# Patient Record
Sex: Female | Born: 1975 | Race: White | Hispanic: No | Marital: Married | Smoking: Never smoker
Health system: Southern US, Community
[De-identification: ages and names within clinical notes are randomized; demographics above are authoritative.]

## PROBLEM LIST (undated history)

## (undated) DIAGNOSIS — R519 Headache, unspecified: Secondary | ICD-10-CM

## (undated) DIAGNOSIS — E0590 Thyrotoxicosis, unspecified without thyrotoxic crisis or storm: Secondary | ICD-10-CM

## (undated) DIAGNOSIS — R51 Headache: Secondary | ICD-10-CM

## (undated) DIAGNOSIS — F32A Depression, unspecified: Secondary | ICD-10-CM

## (undated) DIAGNOSIS — F329 Major depressive disorder, single episode, unspecified: Secondary | ICD-10-CM

## (undated) DIAGNOSIS — I341 Nonrheumatic mitral (valve) prolapse: Secondary | ICD-10-CM

## (undated) DIAGNOSIS — R112 Nausea with vomiting, unspecified: Secondary | ICD-10-CM

## (undated) DIAGNOSIS — Z9889 Other specified postprocedural states: Secondary | ICD-10-CM

## (undated) DIAGNOSIS — R011 Cardiac murmur, unspecified: Secondary | ICD-10-CM

## (undated) HISTORY — PX: WISDOM TOOTH EXTRACTION: SHX21

---

## 2008-04-11 ENCOUNTER — Encounter: Admission: RE | Admit: 2008-04-11 | Discharge: 2008-04-11 | Payer: Self-pay | Admitting: Obstetrics and Gynecology

## 2008-06-15 ENCOUNTER — Inpatient Hospital Stay (HOSPITAL_COMMUNITY): Admission: AD | Admit: 2008-06-15 | Discharge: 2008-06-19 | Payer: Self-pay | Admitting: Obstetrics and Gynecology

## 2010-05-21 LAB — GLUCOSE, CAPILLARY
Glucose-Capillary: 103 mg/dL — ABNORMAL HIGH (ref 70–99)
Glucose-Capillary: 76 mg/dL (ref 70–99)

## 2010-05-21 LAB — CCBB MATERNAL DONOR DRAW

## 2010-05-21 LAB — CBC
Hemoglobin: 11.6 g/dL — ABNORMAL LOW (ref 12.0–15.0)
Platelets: 193 10*3/uL (ref 150–400)
RBC: 4.28 MIL/uL (ref 3.87–5.11)
WBC: 12.6 10*3/uL — ABNORMAL HIGH (ref 4.0–10.5)

## 2010-06-25 NOTE — Discharge Summary (Signed)
Peggy [name], Peggy [name]              ACCOUNT NO.:  [id number]   MEDICAL RECORD NO.:  [id number]          PATIENT TYPE:  INP   LOCATION:  9104                          FACILITY:  [facility]   PHYSICIAN:  Freddy Finner, M.D.   DATE OF BIRTH:  [birth year]   DATE OF ADMISSION:  06/15/2008  DATE OF DISCHARGE:  06/19/2008                               DISCHARGE SUMMARY   ADMITTING DIAGNOSES:  1. Intrauterine pregnancy at 58 weeks' estimated gestational age.  2. Large for gestational age infant.  3. Gestational diabetes.  4. Induction of labor.   DISCHARGE DIAGNOSES:  1. Status post low transverse cesarean section secondary to macrosomia      and arrest of dilatation.  2. A viable female infant.   PROCEDURE:  Primary low transverse cesarean section.   REASON FOR ADMISSION:  Please see written H and P.   HOSPITAL COURSE:  The patient is a 35 year old primigravida that was  admitted to [facility] for induction of labor secondary  to large for gestational age infant and gestational diabetes.  On the  morning of admission, the patient was admitted to labor and delivery  where Pitocin was started to augment her labor.  On admission, cervix  was dilated to 1 cm, 80% effaced with vertex presentation.  Artificial  rupture of membranes was performed, which revealed watery meconium-  stained fluid.  Epidural was placed for her comfort.  Over several hours  of labor, the patient did not make further progression.  A decision was  made to proceed with a primary low transverse cesarean section.  The  patient was then transferred to the operating room where epidural was  dosed to an adequate surgical level.  A low transverse incision was made  with delivery of a viable female infant, weighing 9 pounds with Apgars  of 5 at 1-minute and 8 at 5-minute.  The patient tolerated the procedure  well and was taken to the recovery room in stable condition.  On  postoperative day #1, the patient was  without complaint.  Vital signs  were stable.  Abdomen was soft.  Abdominal dressings noted to be clean,  dry, and intact.  Fundus was firm and nontender.  Hemoglobin was 9.0.  On postoperative day #2, the patient was without complaint.  Vital signs  remained stable.  She was afebrile.  Abdominal dressing had been removed  revealing an incision that is clean, dry, and intact.  On postoperative  day #3, the patient was without complaint.  Vital signs remained stable.  She was afebrile.  Fundus was firm and nontender.  Incision was clean,  dry, and intact.  The staples were removed and the patient was later  discharged home.   CONDITION ON DISCHARGE:  Stable.   DIET:  Regular as tolerated.   ACTIVITY:  No heavy lifting, no driving x2 weeks, and no vaginal entry.   FOLLOWUP:  The patient is to follow up in the office in 1-2 weeks for an  incision check.  She is to call for temperature greater than 100  degrees, persistent nausea, vomiting, heavy vaginal  bleeding and/or  redness or drainage from the incisional site.   DISCHARGE MEDICATIONS:  1. Tylox #30 one p.o. every 4-6 hours p.r.n.  2. Motrin 600 mg every 6 hours.  3. Prenatal vitamins 1 p.o. daily.  4. Colace 1 p.o. daily p.r.n.      [name], N.P.      Freddy Finner, M.D.  Electronically Signed    CC/MEDQ  D:  06/19/2008  T:  06/19/2008  Job:  [id number]

## 2010-06-25 NOTE — Op Note (Signed)
NAMELAUREL, HARNDEN              ACCOUNT NO.:  [id number]   MEDICAL RECORD NO.:  [id number]          PATIENT TYPE:  INP   LOCATION:  9104                          FACILITY:  [facility]   PHYSICIAN:  Michelle L. Grewal, M.D.DATE OF BIRTH:  1975/05/09   DATE OF PROCEDURE:  06/16/2008  DATE OF DISCHARGE:                               OPERATIVE REPORT   PREOPERATIVE DIAGNOSIS:  Intrauterine pregnancy at 39+ weeks and arrest  of dilation at 4 cm.   POSTOPERATIVE DIAGNOSIS:  Intrauterine pregnancy at 39+ weeks and arrest  of dilation at 4 cm.   PROCEDURE:  Primary low-transverse cesarean section.   SURGEON:  Michelle L. Grewal, MD   ANESTHESIA:  Epidural.   FINDINGS:  Female infant, cephalic presentation, Apgars 9 at 1 minute  and 9 at 5 minutes, and weighed 9 pounds.   ESTIMATED BLOOD LOSS:  Less than 500 mL.   DRAINS:  Foley.   COMPLICATIONS:  None.   PROCEDURE:  The patient was taken to the operating room.  Her epidural  had been dosed and she was found to be adequate.  She was prepped and  draped in usual sterile fashion.  A Foley catheter had been inserted  while on labor and delivery and was draining clear urine.  A low  transverse incision was made, carried down to the fascia.  Fascia scored  in midline and extended laterally.  The rectus muscles were separated in  the midline.  The peritoneum was entered bluntly.  The peritoneal  incision was then stretched.  The bladder blade was inserted.  The lower  uterine segment was identified.  The bladder flap was created sharply  and then digitally.  The bladder blade was then readjusted.  A low  transverse incision was made in the uterus.  The uterus was entered  using a hemostat.  The baby was in OP position and was delivered easily  with 1 gentle pull of the vacuum extractor.  The baby was definitely LGA  and weighed 9 pounds.  Apgars were 9 at 1 minute and 9 at 5 minutes.  The baby was a female.  The baby was handed to the  awaiting neonatal  team and taken to the newborn nursery.  The uterus was exteriorized.  It  was cleared of all clots and debris.  After the placenta was removed and  noted be normal intact with a 3-vessel cord plus Pitocin antibiotics  were given.  The uterus was cleared of all clots and debris.  The  uterine incision was closed in 1 layer using O chromic in a running  locked stitch.  Uterus was returned to the abdomen.  Irrigation was  performed.  The  peritoneum and rectus muscles were reapproximated using O Vicryl.  The  fascia was closed using O Vicryl in a running stitch. After irrigation  of the subcutaneous layer, the skin was closed with staples.  All  sponge, lap, and instrument counts were correct x2.  The patient went to  recovery room in stable condition.      Michelle L. Vincente Poli, M.D.  Electronically Signed  MLG/MEDQ  D:  06/16/2008  T:  06/17/2008  Job:  [id number]

## 2011-04-02 LAB — OB RESULTS CONSOLE RPR: RPR: NONREACTIVE

## 2011-04-02 LAB — OB RESULTS CONSOLE ABO/RH

## 2011-04-02 LAB — OB RESULTS CONSOLE GC/CHLAMYDIA: Gonorrhea: NEGATIVE

## 2011-04-02 LAB — OB RESULTS CONSOLE HIV ANTIBODY (ROUTINE TESTING): HIV: NONREACTIVE

## 2011-08-27 ENCOUNTER — Encounter: Payer: Managed Care, Other (non HMO) | Attending: Obstetrics and Gynecology | Admitting: *Deleted

## 2011-08-27 VITALS — Ht 65.0 in | Wt 185.3 lb

## 2011-08-27 DIAGNOSIS — O24419 Gestational diabetes mellitus in pregnancy, unspecified control: Secondary | ICD-10-CM

## 2011-08-27 DIAGNOSIS — O99810 Abnormal glucose complicating pregnancy: Secondary | ICD-10-CM | POA: Insufficient documentation

## 2011-08-27 DIAGNOSIS — Z713 Dietary counseling and surveillance: Secondary | ICD-10-CM | POA: Insufficient documentation

## 2011-08-28 ENCOUNTER — Encounter: Payer: Self-pay | Admitting: *Deleted

## 2011-08-28 NOTE — Progress Notes (Signed)
  Patient was seen on 08/27/11 for Gestational Diabetes self-management class at the [facility]. The following learning objectives were met by the patient during this course:   States the definition of Gestational Diabetes  States why dietary management is important in controlling blood glucose  Describes the effects each nutrient has on blood glucose levels  Demonstrates ability to create a balanced meal plan  Demonstrates carbohydrate counting   States when to check blood glucose levels  Demonstrates proper blood glucose monitoring techniques  States the effect of stress and exercise on blood glucose levels  States the importance of limiting caffeine and abstaining from alcohol and smoking  Blood glucose monitor given:  One Touch Ultra Mini Self Monitoring Kit Lot # [id number] X Exp: 1/14 Blood glucose reading: 92 md/dl  Patient instructed to monitor glucose levels: FBS: 60 - <90 2 hour: <120  *Patient received handouts:  Nutrition Diabetes and Pregnancy  Carbohydrate Counting List  Patient will be seen for follow-up as needed.

## 2011-08-28 NOTE — Patient Instructions (Signed)
Goals:  Check glucose levels per MD as instructed  Follow Gestational Diabetes Diet as instructed  Call for follow-up as needed    

## 2011-10-27 ENCOUNTER — Encounter (HOSPITAL_COMMUNITY): Payer: Self-pay

## 2011-10-30 ENCOUNTER — Encounter (HOSPITAL_COMMUNITY): Payer: Self-pay

## 2011-10-31 ENCOUNTER — Encounter (HOSPITAL_COMMUNITY): Payer: Self-pay

## 2011-10-31 ENCOUNTER — Encounter (HOSPITAL_COMMUNITY)
Admission: RE | Admit: 2011-10-31 | Discharge: 2011-10-31 | Disposition: A | Discharge status: Home / Self Care | Payer: Managed Care, Other (non HMO) | Source: Ambulatory Visit | Attending: Obstetrics and Gynecology | Admitting: Obstetrics and Gynecology

## 2011-10-31 HISTORY — DX: Other specified postprocedural states: R11.2

## 2011-10-31 HISTORY — DX: Other specified postprocedural states: Z98.890

## 2011-10-31 LAB — SURGICAL PCR SCREEN: Staphylococcus aureus: POSITIVE — AB

## 2011-10-31 LAB — RPR: RPR Ser Ql: NONREACTIVE

## 2011-10-31 LAB — CBC
HCT: 37.6 % (ref 36.0–46.0)
RDW: 13 % (ref 11.5–15.5)
WBC: 9.8 10*3/uL (ref 4.0–10.5)

## 2011-10-31 LAB — TYPE AND SCREEN
ABO/RH(D): O POS
Antibody Screen: NEGATIVE

## 2011-10-31 NOTE — Patient Instructions (Signed)
Your procedure is scheduled on: 11/06/11  Enter through the Main Entrance at :0600 am Pick up desk phone and dial [phone] and inform us of your arrival.  Please call [phone] if you have any problems the morning of surgery.  Remember: Do not eat after midnight: Wed. Do not drink after:3:30 am Wed -water only  Take these meds the morning of surgery with a sip of water:none  DO NOT wear jewelry, eye make-up, lipstick,body lotion, or dark fingernail polish. Do not shave for 48 hours prior to surgery.  If you are to be admitted after surgery, leave suitcase in car until your room has been assigned. Patients discharged on the day of surgery will not be allowed to drive home.   Remember to use your Hibiclens as instructed.

## 2011-11-05 NOTE — H&P (Addendum)
36yo [id number] @ 39wks presents for rpt c-section.  Pregnancy complicated by A1DM, good BG control.  PMHx:  Anx/depression PSHx:  c-section, bartholin's cyst removed All:  None Meds:  PNV SHx:  Negative x 3  AF, VSS Gen - NAD ABd - gravid, NT CV - RRR Lungs - clear bilaterally Ext - NT  A/P:  Prior c-section, desires repeat R/b/a discussed, informed consent

## 2011-11-06 ENCOUNTER — Encounter (HOSPITAL_COMMUNITY): Payer: Self-pay | Admitting: *Deleted

## 2011-11-06 ENCOUNTER — Encounter (HOSPITAL_COMMUNITY)
Admission: RE | Disposition: A | Discharge status: Home / Self Care | Payer: Self-pay | Source: Ambulatory Visit | Attending: Obstetrics and Gynecology

## 2011-11-06 ENCOUNTER — Encounter (HOSPITAL_COMMUNITY): Payer: Self-pay | Admitting: Anesthesiology

## 2011-11-06 ENCOUNTER — Inpatient Hospital Stay (HOSPITAL_COMMUNITY): Payer: Managed Care, Other (non HMO) | Admitting: Anesthesiology

## 2011-11-06 ENCOUNTER — Inpatient Hospital Stay (HOSPITAL_COMMUNITY)
Admission: RE | Admit: 2011-11-06 | Discharge: 2011-11-08 | DRG: 765 | Disposition: A | Discharge status: Home / Self Care | Payer: Managed Care, Other (non HMO) | Source: Ambulatory Visit | Attending: Obstetrics and Gynecology | Admitting: Obstetrics and Gynecology

## 2011-11-06 DIAGNOSIS — E119 Type 2 diabetes mellitus without complications: Secondary | ICD-10-CM | POA: Diagnosis present

## 2011-11-06 DIAGNOSIS — O34219 Maternal care for unspecified type scar from previous cesarean delivery: Principal | ICD-10-CM | POA: Diagnosis present

## 2011-11-06 DIAGNOSIS — O09529 Supervision of elderly multigravida, unspecified trimester: Secondary | ICD-10-CM | POA: Diagnosis present

## 2011-11-06 DIAGNOSIS — O2432 Unspecified pre-existing diabetes mellitus in childbirth: Secondary | ICD-10-CM | POA: Diagnosis present

## 2011-11-06 LAB — TYPE AND SCREEN: Antibody Screen: NEGATIVE

## 2011-11-06 LAB — GLUCOSE, CAPILLARY: Glucose-Capillary: 100 mg/dL — ABNORMAL HIGH (ref 70–99)

## 2011-11-06 SURGERY — Surgical Case
Anesthesia: Spinal | Site: Abdomen | Wound class: Clean Contaminated

## 2011-11-06 MED ORDER — KETOROLAC TROMETHAMINE 30 MG/ML IJ SOLN: 30.0000 mg | Freq: Four times a day (QID) | INTRAMUSCULAR | Status: AC | PRN

## 2011-11-06 MED ORDER — KETOROLAC TROMETHAMINE 60 MG/2ML IM SOLN
60.0000 mg | Freq: Once | INTRAMUSCULAR | Status: AC | PRN
  Administered 2011-11-06: 60 mg via INTRAMUSCULAR

## 2011-11-06 MED ORDER — LACTATED RINGERS IV SOLN
INTRAVENOUS | Status: DC
  Administered 2011-11-06 (×3): via INTRAVENOUS

## 2011-11-06 MED ORDER — SIMETHICONE 80 MG PO CHEW: 80.0000 mg | CHEWABLE_TABLET | ORAL | Status: DC | PRN

## 2011-11-06 MED ORDER — MEASLES, MUMPS & RUBELLA VAC ~~LOC~~ INJ
0.5000 mL | INJECTION | Freq: Once | SUBCUTANEOUS | Status: DC
  Filled 2011-11-06: qty 0.5

## 2011-11-06 MED ORDER — PHENYLEPHRINE HCL 10 MG/ML IJ SOLN
INTRAMUSCULAR | Status: DC | PRN
  Administered 2011-11-06: 40 ug via INTRAVENOUS

## 2011-11-06 MED ORDER — MORPHINE SULFATE (PF) 0.5 MG/ML IJ SOLN
INTRAMUSCULAR | Status: DC | PRN
  Administered 2011-11-06: .1 mg via INTRATHECAL

## 2011-11-06 MED ORDER — OXYTOCIN 10 UNIT/ML IJ SOLN
INTRAMUSCULAR | Status: AC
  Filled 2011-11-06: qty 4

## 2011-11-06 MED ORDER — METOCLOPRAMIDE HCL 5 MG/ML IJ SOLN
INTRAMUSCULAR | Status: AC
  Filled 2011-11-06: qty 2

## 2011-11-06 MED ORDER — ONDANSETRON HCL 4 MG/2ML IJ SOLN
INTRAMUSCULAR | Status: AC
  Filled 2011-11-06: qty 2

## 2011-11-06 MED ORDER — NALBUPHINE HCL 10 MG/ML IJ SOLN
5.0000 mg | INTRAMUSCULAR | Status: DC | PRN
  Filled 2011-11-06: qty 1

## 2011-11-06 MED ORDER — DIPHENHYDRAMINE HCL 50 MG/ML IJ SOLN: 25.0000 mg | INTRAMUSCULAR | Status: DC | PRN

## 2011-11-06 MED ORDER — LANOLIN HYDROUS EX OINT: 1.0000 "application " | TOPICAL_OINTMENT | CUTANEOUS | Status: DC | PRN

## 2011-11-06 MED ORDER — ONDANSETRON HCL 4 MG/2ML IJ SOLN
INTRAMUSCULAR | Status: DC | PRN
  Administered 2011-11-06 (×2): 4 mg via INTRAVENOUS

## 2011-11-06 MED ORDER — MEDROXYPROGESTERONE ACETATE 150 MG/ML IM SUSP: 150.0000 mg | INTRAMUSCULAR | Status: DC | PRN

## 2011-11-06 MED ORDER — DIPHENHYDRAMINE HCL 25 MG PO CAPS: 25.0000 mg | ORAL_CAPSULE | ORAL | Status: DC | PRN

## 2011-11-06 MED ORDER — ONDANSETRON HCL 4 MG/2ML IJ SOLN: 4.0000 mg | Freq: Three times a day (TID) | INTRAMUSCULAR | Status: DC | PRN

## 2011-11-06 MED ORDER — MENTHOL 3 MG MT LOZG: 1.0000 | LOZENGE | OROMUCOSAL | Status: DC | PRN

## 2011-11-06 MED ORDER — DIPHENHYDRAMINE HCL 25 MG PO CAPS: 25.0000 mg | ORAL_CAPSULE | Freq: Four times a day (QID) | ORAL | Status: DC | PRN

## 2011-11-06 MED ORDER — WITCH HAZEL-GLYCERIN EX PADS: 1.0000 "application " | MEDICATED_PAD | CUTANEOUS | Status: DC | PRN

## 2011-11-06 MED ORDER — MORPHINE SULFATE 0.5 MG/ML IJ SOLN
INTRAMUSCULAR | Status: AC
  Filled 2011-11-06: qty 10

## 2011-11-06 MED ORDER — DEXTROSE IN LACTATED RINGERS 5 % IV SOLN: INTRAVENOUS | Status: DC

## 2011-11-06 MED ORDER — KETOROLAC TROMETHAMINE 60 MG/2ML IM SOLN
INTRAMUSCULAR | Status: AC
  Administered 2011-11-06: 60 mg via INTRAMUSCULAR
  Filled 2011-11-06: qty 2

## 2011-11-06 MED ORDER — CEFAZOLIN SODIUM-DEXTROSE 2-3 GM-% IV SOLR: 2.0000 g | INTRAVENOUS | Status: DC

## 2011-11-06 MED ORDER — SIMETHICONE 80 MG PO CHEW
80.0000 mg | CHEWABLE_TABLET | Freq: Three times a day (TID) | ORAL | Status: DC
  Administered 2011-11-06 – 2011-11-08 (×6): 80 mg via ORAL

## 2011-11-06 MED ORDER — IBUPROFEN 600 MG PO TABS
600.0000 mg | ORAL_TABLET | Freq: Four times a day (QID) | ORAL | Status: DC
  Administered 2011-11-07 – 2011-11-08 (×6): 600 mg via ORAL

## 2011-11-06 MED ORDER — FENTANYL CITRATE 0.05 MG/ML IJ SOLN
INTRAMUSCULAR | Status: AC
  Filled 2011-11-06: qty 2

## 2011-11-06 MED ORDER — NALOXONE HCL 0.4 MG/ML IJ SOLN: 0.4000 mg | INTRAMUSCULAR | Status: DC | PRN

## 2011-11-06 MED ORDER — SCOPOLAMINE 1 MG/3DAYS TD PT72
MEDICATED_PATCH | TRANSDERMAL | Status: AC
  Administered 2011-11-06: 1.5 mg via TRANSDERMAL
  Filled 2011-11-06: qty 1

## 2011-11-06 MED ORDER — OXYCODONE-ACETAMINOPHEN 5-325 MG PO TABS: 1.0000 | ORAL_TABLET | ORAL | Status: DC | PRN

## 2011-11-06 MED ORDER — MEPERIDINE HCL 25 MG/ML IJ SOLN: 6.2500 mg | INTRAMUSCULAR | Status: DC | PRN

## 2011-11-06 MED ORDER — CEFAZOLIN SODIUM-DEXTROSE 2-3 GM-% IV SOLR
INTRAVENOUS | Status: AC
  Administered 2011-11-06: 2 g via INTRAVENOUS
  Filled 2011-11-06: qty 50

## 2011-11-06 MED ORDER — OXYTOCIN 40 UNITS IN LACTATED RINGERS INFUSION - SIMPLE MED: 62.5000 mL/h | INTRAVENOUS | Status: AC

## 2011-11-06 MED ORDER — SENNOSIDES-DOCUSATE SODIUM 8.6-50 MG PO TABS
2.0000 | ORAL_TABLET | Freq: Every day | ORAL | Status: DC
  Administered 2011-11-06 – 2011-11-07 (×2): 2 via ORAL

## 2011-11-06 MED ORDER — FENTANYL CITRATE 0.05 MG/ML IJ SOLN: 25.0000 ug | INTRAMUSCULAR | Status: DC | PRN

## 2011-11-06 MED ORDER — SCOPOLAMINE 1 MG/3DAYS TD PT72
1.0000 | MEDICATED_PATCH | Freq: Once | TRANSDERMAL | Status: DC
  Administered 2011-11-06: 1.5 mg via TRANSDERMAL

## 2011-11-06 MED ORDER — SODIUM CHLORIDE 0.9 % IJ SOLN: 3.0000 mL | INTRAMUSCULAR | Status: DC | PRN

## 2011-11-06 MED ORDER — SODIUM CHLORIDE 0.9 % IV SOLN
1.0000 ug/kg/h | INTRAVENOUS | Status: DC | PRN
  Filled 2011-11-06: qty 2.5

## 2011-11-06 MED ORDER — IBUPROFEN 600 MG PO TABS
600.0000 mg | ORAL_TABLET | Freq: Four times a day (QID) | ORAL | Status: DC | PRN
  Filled 2011-11-06 (×6): qty 1

## 2011-11-06 MED ORDER — DIBUCAINE 1 % RE OINT: 1.0000 "application " | TOPICAL_OINTMENT | RECTAL | Status: DC | PRN

## 2011-11-06 MED ORDER — KETOROLAC TROMETHAMINE 30 MG/ML IJ SOLN
30.0000 mg | Freq: Four times a day (QID) | INTRAMUSCULAR | Status: AC | PRN
  Administered 2011-11-06: 30 mg via INTRAVENOUS
  Filled 2011-11-06: qty 1

## 2011-11-06 MED ORDER — METOCLOPRAMIDE HCL 5 MG/ML IJ SOLN
INTRAMUSCULAR | Status: DC | PRN
  Administered 2011-11-06 (×2): 5 mg via INTRAVENOUS

## 2011-11-06 MED ORDER — DIPHENHYDRAMINE HCL 50 MG/ML IJ SOLN: 12.5000 mg | INTRAMUSCULAR | Status: DC | PRN

## 2011-11-06 MED ORDER — BUPIVACAINE IN DEXTROSE 0.75-8.25 % IT SOLN
INTRATHECAL | Status: DC | PRN
  Administered 2011-11-06: 1.5 mL via INTRATHECAL

## 2011-11-06 MED ORDER — OXYTOCIN 40 UNITS IN LACTATED RINGERS INFUSION - SIMPLE MED
INTRAVENOUS | Status: DC | PRN
  Administered 2011-11-06: 40 [IU] via INTRAVENOUS

## 2011-11-06 MED ORDER — SCOPOLAMINE 1 MG/3DAYS TD PT72
1.0000 | MEDICATED_PATCH | Freq: Once | TRANSDERMAL | Status: DC
  Filled 2011-11-06: qty 1

## 2011-11-06 MED ORDER — TETANUS-DIPHTH-ACELL PERTUSSIS 5-2.5-18.5 LF-MCG/0.5 IM SUSP: 0.5000 mL | Freq: Once | INTRAMUSCULAR | Status: DC

## 2011-11-06 MED ORDER — ONDANSETRON HCL 4 MG/2ML IJ SOLN: 4.0000 mg | INTRAMUSCULAR | Status: DC | PRN

## 2011-11-06 MED ORDER — EPHEDRINE SULFATE 50 MG/ML IJ SOLN
INTRAMUSCULAR | Status: DC | PRN
  Administered 2011-11-06 (×2): 10 mg via INTRAVENOUS
  Administered 2011-11-06 (×2): 5 mg via INTRAVENOUS

## 2011-11-06 MED ORDER — METOCLOPRAMIDE HCL 5 MG/ML IJ SOLN: 10.0000 mg | Freq: Three times a day (TID) | INTRAMUSCULAR | Status: DC | PRN

## 2011-11-06 MED ORDER — PRENATAL MULTIVITAMIN CH
1.0000 | ORAL_TABLET | Freq: Every day | ORAL | Status: DC
  Administered 2011-11-07 – 2011-11-08 (×2): 1 via ORAL
  Filled 2011-11-06 (×2): qty 1

## 2011-11-06 MED ORDER — FENTANYL CITRATE 0.05 MG/ML IJ SOLN
INTRAMUSCULAR | Status: DC | PRN
  Administered 2011-11-06: 15 ug via INTRATHECAL

## 2011-11-06 MED ORDER — ONDANSETRON HCL 4 MG PO TABS: 4.0000 mg | ORAL_TABLET | ORAL | Status: DC | PRN

## 2011-11-06 MED ORDER — PHENYLEPHRINE 40 MCG/ML (10ML) SYRINGE FOR IV PUSH (FOR BLOOD PRESSURE SUPPORT)
PREFILLED_SYRINGE | INTRAVENOUS | Status: AC
  Filled 2011-11-06: qty 5

## 2011-11-06 MED ORDER — LACTATED RINGERS IV SOLN
INTRAVENOUS | Status: DC | PRN
  Administered 2011-11-06: 08:00:00 via INTRAVENOUS

## 2011-11-06 MED ORDER — EPHEDRINE 5 MG/ML INJ
INTRAVENOUS | Status: AC
  Filled 2011-11-06: qty 10

## 2011-11-06 SURGICAL SUPPLY — 30 items
CLOTH BEACON ORANGE TIMEOUT ST (SAFETY) ×2 IMPLANT
DERMABOND ADVANCED (GAUZE/BANDAGES/DRESSINGS) ×1
DERMABOND ADVANCED .7 DNX12 (GAUZE/BANDAGES/DRESSINGS) ×1 IMPLANT
DRAPE SURG 17X23 STRL (DRAPES) ×2 IMPLANT
DRSG COVADERM 4X10 (GAUZE/BANDAGES/DRESSINGS) ×2 IMPLANT
DURAPREP 26ML APPLICATOR (WOUND CARE) ×2 IMPLANT
ELECT REM PT RETURN 9FT ADLT (ELECTROSURGICAL) ×2
ELECTRODE REM PT RTRN 9FT ADLT (ELECTROSURGICAL) ×1 IMPLANT
EXTRACTOR VACUUM M CUP 4 TUBE (SUCTIONS) IMPLANT
GLOVE BIO SURGEON STRL SZ 6.5 (GLOVE) ×2 IMPLANT
GLOVE BIOGEL PI IND STRL 7.0 (GLOVE) ×2 IMPLANT
GLOVE BIOGEL PI INDICATOR 7.0 (GLOVE) ×2
GOWN PREVENTION PLUS LG XLONG (DISPOSABLE) ×4 IMPLANT
GOWN STRL REIN XL XLG (GOWN DISPOSABLE) IMPLANT
KIT ABG SYR 3ML LUER SLIP (SYRINGE) ×2 IMPLANT
NEEDLE HYPO 25X5/8 SAFETYGLIDE (NEEDLE) IMPLANT
NS IRRIG 1000ML POUR BTL (IV SOLUTION) ×2 IMPLANT
PACK C SECTION WH (CUSTOM PROCEDURE TRAY) ×2 IMPLANT
PAD OB MATERNITY 4.3X12.25 (PERSONAL CARE ITEMS) ×2 IMPLANT
SLEEVE SCD COMPRESS KNEE MED (MISCELLANEOUS) IMPLANT
STAPLER VISISTAT 35W (STAPLE) IMPLANT
SUT CHROMIC 0 CT 802H (SUTURE) IMPLANT
SUT CHROMIC 0 CTX 36 (SUTURE) ×6 IMPLANT
SUT MON AB-0 CT1 36 (SUTURE) ×2 IMPLANT
SUT PDS AB 0 CTX 60 (SUTURE) ×2 IMPLANT
SUT PLAIN 0 NONE (SUTURE) IMPLANT
SUT VIC AB 4-0 KS 27 (SUTURE) ×2 IMPLANT
TOWEL OR 17X24 6PK STRL BLUE (TOWEL DISPOSABLE) ×4 IMPLANT
TRAY FOLEY CATH 14FR (SET/KITS/TRAYS/PACK) IMPLANT
WATER STERILE IRR 1000ML POUR (IV SOLUTION) ×2 IMPLANT

## 2011-11-06 NOTE — Addendum Note (Signed)
Addendum  created 11/06/11 1713 by Renford Dills, CRNA   Modules edited:Notes Section

## 2011-11-06 NOTE — Anesthesia Postprocedure Evaluation (Signed)
  Anesthesia Post-op Note  Patient: Peggy [name]  Procedure(s) Performed: Procedure(s) (LRB) with comments: CESAREAN SECTION (N/A) - REPEAT EDC 11/11/11  Patient is awake, responsive, moving her legs, and has signs of resolution of her numbness. Pain and nausea are reasonably well controlled. Vital signs are stable and clinically acceptable. Oxygen saturation is clinically acceptable. There are no apparent anesthetic complications at this time. Patient is ready for discharge.

## 2011-11-06 NOTE — Anesthesia Postprocedure Evaluation (Signed)
  Anesthesia Post-op Note  Patient: Peggy [name]  Procedure(s) Performed: Procedure(s) (LRB) with comments: CESAREAN SECTION (N/A) - REPEAT EDC 11/11/11  Patient Location: Mother/Baby  Anesthesia Type: Spinal  Level of Consciousness: awake  Airway and Oxygen Therapy: Patient Spontanous Breathing  Post-op Pain: mild  Post-op Assessment: Patient's Cardiovascular Status Stable and Respiratory Function Stable  Post-op Vital Signs: stable  Complications: No apparent anesthesia complications

## 2011-11-06 NOTE — Op Note (Signed)
Cesarean Section Procedure Note   Peggy [name]  11/06/2011  Indications: Scheduled Proceedure/Maternal Request   Pre-operative Diagnosis: PREVIOUS.   Post-operative Diagnosis: Same   Surgeon: Surgeon(s) and Role:    * Zelphia Cairo, MD - Primary   Assistants: none  Anesthesia: spinal   Procedure Details:  The patient was seen in the Holding Room. The risks, benefits, complications, treatment options, and expected outcomes were discussed with the patient. The patient concurred with the proposed plan, giving informed consent. identified as Peggy [name] and the procedure verified as C-Section Delivery. A Time Out was held and the above information confirmed.  After induction of anesthesia, the patient was draped and prepped in the usual sterile manner. A transverse was made and carried down through the subcutaneous tissue to the fascia. Fascial incision was made and extended transversely. The fascia was separated from the underlying rectus tissue superiorly and inferiorly. The peritoneum was identified and entered. Peritoneal incision was extended longitudinally. The utero-vesical peritoneal reflection was incised transversely and the bladder flap was bluntly freed from the lower uterine segment. A low transverse uterine incision was made. Delivered from cephalic presentation was a viable female with Apgar scores of 9 at one minute and 9 at five minutes. Cord ph was not sent the umbilical cord was clamped and cut cord blood was obtained for evaluation. The placenta was removed Intact and appeared normal. The uterine outline, tubes and ovaries appeared normal}. The uterine incision was closed with running locked sutures of 0chromic gut.   Hemostasis was observed. Lavage was carried out until clear.  Peritoneum was closed w/ 0 monocryl. The fascia was then reapproximated with running sutures of 0PDS.  The skin was closed with 4-0Vicryl.   Instrument, sponge, and needle counts were correct  prior the abdominal closure and were correct at the conclusion of the case.     Estimated Blood Loss: 900cc  Urine Output: clear  Specimens: @ORSPECIMEN @   Complications: no complications  Disposition: PACU - hemodynamically stable.   Maternal Condition: stable   Baby condition / location:  nursery-stable  Attending Attestation: I was present and scrubbed for the entire procedure.   Signed: Surgeon(s): Zelphia Cairo, MD

## 2011-11-06 NOTE — Anesthesia Preprocedure Evaluation (Signed)
Anesthesia Evaluation  Patient identified by MRN, date of birth, ID band Patient awake    Reviewed: Allergy & Precautions, H&P , NPO status , Patient's Chart, lab work & pertinent test results, reviewed documented beta blocker date and time   History of Anesthesia Complications (+) PONV  Airway Mallampati: I TM Distance: >3 FB Neck ROM: full    Dental  (+) Teeth Intact   Pulmonary neg pulmonary ROS,  breath sounds clear to auscultation        Cardiovascular negative cardio ROS  Rhythm:regular Rate:Normal     Neuro/Psych negative neurological ROS  negative psych ROS   GI/Hepatic negative GI ROS, Neg liver ROS,   Endo/Other  diabetes (diet controlled), Well Controlled, Gestational  Renal/GU negative Renal ROS  negative genitourinary   Musculoskeletal   Abdominal   Peds  Hematology negative hematology ROS (+)   Anesthesia Other Findings   Reproductive/Obstetrics (+) Pregnancy (h/o c/s x1)                           Anesthesia Physical Anesthesia Plan  ASA: II  Anesthesia Plan: Spinal   Post-op Pain Management:    Induction:   Airway Management Planned:   Additional Equipment:   Intra-op Plan:   Post-operative Plan:   Informed Consent: I have reviewed the patients History and Physical, chart, labs and discussed the procedure including the risks, benefits and alternatives for the proposed anesthesia with the patient or authorized representative who has indicated his/her understanding and acceptance.     Plan Discussed with: Surgeon and CRNA  Anesthesia Plan Comments:         Anesthesia Quick Evaluation

## 2011-11-06 NOTE — Anesthesia Procedure Notes (Signed)
Spinal  Patient location during procedure: OR Start time: 11/06/2011 7:31 AM Staffing Performed by: anesthesiologist  Preanesthetic Checklist Completed: patient identified, site marked, surgical consent, pre-op evaluation, timeout performed, IV checked, risks and benefits discussed and monitors and equipment checked Spinal Block Patient position: sitting Prep: site prepped and draped and DuraPrep Patient monitoring: heart rate, cardiac monitor, continuous pulse ox and blood pressure Approach: midline Location: L3-4 Injection technique: single-shot Needle Needle type: Sprotte  Needle gauge: 24 G Needle length: 9 cm Assessment Sensory level: T4 Additional Notes Clear free flow CSF on first attempt.  No paresthesia.  Patient tolerated procedure well.  Jasmine December, MD

## 2011-11-06 NOTE — Transfer of Care (Signed)
Immediate Anesthesia Transfer of Care Note  Patient: Peggy [name]  Procedure(s) Performed: Procedure(s) (LRB) with comments: CESAREAN SECTION (N/A) - REPEAT EDC 11/11/11  Patient Location: PACU  Anesthesia Type: Spinal  Level of Consciousness: awake, alert  and oriented  Airway & Oxygen Therapy: Patient Spontanous Breathing  Post-op Assessment: Report given to PACU RN and Post -op Vital signs reviewed and stable  Post vital signs: Reviewed and stable  Complications: No apparent anesthesia complications

## 2011-11-07 ENCOUNTER — Encounter (HOSPITAL_COMMUNITY): Payer: Self-pay | Admitting: Obstetrics and Gynecology

## 2011-11-07 LAB — CBC
HCT: 28.7 % — ABNORMAL LOW (ref 36.0–46.0)
Hemoglobin: 9.7 g/dL — ABNORMAL LOW (ref 12.0–15.0)
MCH: 28.3 pg (ref 26.0–34.0)
MCHC: 33.8 g/dL (ref 30.0–36.0)
RBC: 3.43 MIL/uL — ABNORMAL LOW (ref 3.87–5.11)

## 2011-11-07 MED ORDER — INFLUENZA VIRUS VACC SPLIT PF IM SUSP
0.5000 mL | INTRAMUSCULAR | Status: AC
Start: 2011-11-08 — End: 2011-11-08
  Administered 2011-11-08: 0.5 mL via INTRAMUSCULAR
  Filled 2011-11-07: qty 0.5

## 2011-11-07 NOTE — Progress Notes (Signed)
Subjective: Postpartum Day 1: Cesarean Delivery Patient reports tolerating PO and no problems voiding.    Objective: Vital signs in last 24 hours: Temp:  [97.6 F (36.4 C)-98.9 F (37.2 C)] 97.9 F (36.6 C) (09/27 0330) Pulse Rate:  [60-88] 74  (09/27 0330) Resp:  [12-20] 18  (09/27 0330) BP: (91-150)/(51-92) 97/60 mmHg (09/27 0330) SpO2:  [96 %-100 %] 96 % (09/27 0330) Weight:  [87.998 kg (194 lb)] 87.998 kg (194 lb) (09/26 1113)  Physical Exam:  General: alert and cooperative Lochia: appropriate Uterine Fundus: firm Incision: abd dressing CDI DVT Evaluation: No evidence of DVT seen on physical exam. Negative Homan's sign.   Basename 11/07/11 0545  HGB 9.7*  HCT 28.7*    Assessment/Plan: Status post Cesarean section. Doing well postoperatively.  Continue current care.  Peggy [name] 11/07/2011, 8:49 AM

## 2011-11-08 MED ORDER — IBUPROFEN 600 MG PO TABS: 600.0000 mg | ORAL_TABLET | Freq: Four times a day (QID) | ORAL | Status: DC | PRN

## 2011-11-08 MED ORDER — OXYCODONE-ACETAMINOPHEN 5-325 MG PO TABS: 1.0000 | ORAL_TABLET | ORAL | Status: DC | PRN

## 2011-11-08 NOTE — Discharge Summary (Signed)
Obstetric Discharge Summary Reason for Admission: cesarean section Prenatal Procedures: ultrasound Intrapartum Procedures: cesarean: low cervical, transverse Postpartum Procedures: none Complications-Operative and Postpartum: none Hemoglobin  Date Value Range Status  11/07/2011 9.7* 12.0 - 15.0 g/dL Final     HCT  Date Value Range Status  11/07/2011 28.7* 36.0 - 46.0 % Final    Physical Exam:  General: alert and cooperative Lochia: appropriate Uterine Fundus: firm Incision: healing well DVT Evaluation: No evidence of DVT seen on physical exam.  Discharge Diagnoses: Term Pregnancy-delivered  Discharge Information: Date: 11/08/2011 Activity: pelvic rest Diet: routine Medications: PNV, Ibuprofen and Percocet Condition: stable Instructions: refer to practice specific booklet Discharge to: home Follow-up Information    Schedule an appointment as soon as possible for a visit in 1 week to follow up.         Newborn Data: Live born female  Birth Weight: 8 lb 12.2 oz (3975 g) APGAR: 9, 9  Home with mother.  Peggy [name] 11/08/2011, 8:13 AM

## 2011-11-12 ENCOUNTER — Ambulatory Visit (HOSPITAL_COMMUNITY)
Admission: RE | Admit: 2011-11-12 | Discharge: 2011-11-12 | Disposition: A | Discharge status: Home / Self Care | Payer: Managed Care, Other (non HMO) | Source: Ambulatory Visit | Attending: Obstetrics & Gynecology | Admitting: Obstetrics & Gynecology

## 2011-11-12 NOTE — Progress Notes (Addendum)
Infant Lactation Consultation Outpatient Visit Note  Patient Name: Peggy [name] Date of Birth: 1975-11-16 Birth Weight:   Gestational Age at Delivery: Gestational Age: <None> Type of Delivery:  c/s  Breastfeeding History Frequency of Breastfeeding: q 2-3 hours Length of Feeding: 30 minutes to 1 hour Voids: 1 today - first one in 24 hours Stools: none in last 24 hours  Supplementing / Method: Pumping:  Type of Pump:manual   Frequency:  Volume:    Comments: has not been pumping. Has given one bottle of formula- 15 cc's yesterday and day before. None today.  Second baby but did not nurse the first baby but a few days- that baby got formula early after C/S and gest diabetic  Consultation Evaluation:  Initial Feeding Assessment: Pre-feed Weight: 7- 7.3 oz  3382g Post-feed Weight: 7-8  3402g Amount Transferred: 20 cc's Comments: Assisted mom in football position. She has been using cradle hold each feeding. Has positional stripe across both nipples. Reports that baby spends a lot of time at the breast but with shallow sucks and no swallows heard. Reviewed awakening techniques and using stimulation to keep baby awake while nursing. Assisted mom with deeper latch and lots of swallows noted. Baby very relaxed after nursing. Mom reports this is the most relaxed she has seen her after feeding. Mom reports that feels much better on her nipples too- not painless yet but better. Encouraged wide open mouth and to keep baby close to breast through the entire feeding.   Additional Feeding Assessment: Pre-feed Weight: 7-8  3402g Post-feed Weight: 7-8.1  3404 Amount Transferred:2 cc's Comments: Baby more sleepy and relaxed- only nursed for 10 minutes then off to sleep. Mom reports that this was a much better feeding and now she knows what to watch for. Comfort gels given with instructions. Mom reports that they feel great on nipples. No questions at present.  Additional Feeding  Assessment: Pre-feed Weight: Post-feed Weight: Amount Transferred: Comments:  Total Breast milk Transferred this Visit: 22cc's Total Supplement Given: 0  Additional Interventions:   Follow-Up  To see Ped tomorrow for weight check.  To call us prn    Pamelia Hoit 11/12/2011, 2:20 PM

## 2013-07-06 ENCOUNTER — Encounter: Payer: Self-pay | Admitting: Endocrinology

## 2013-07-06 ENCOUNTER — Ambulatory Visit (INDEPENDENT_AMBULATORY_CARE_PROVIDER_SITE_OTHER): Payer: Managed Care, Other (non HMO) | Admitting: Endocrinology

## 2013-07-06 VITALS — BP 102/60 | HR 97 | Temp 97.8°F | Resp 12 | Ht 64.25 in | Wt 153.6 lb

## 2013-07-06 DIAGNOSIS — E061 Subacute thyroiditis: Secondary | ICD-10-CM

## 2013-07-06 LAB — T4, FREE: Free T4: 1.97 ng/dL — ABNORMAL HIGH (ref 0.60–1.60)

## 2013-07-06 LAB — SEDIMENTATION RATE: Sed Rate: 48 mm/hr — ABNORMAL HIGH (ref 0–22)

## 2013-07-06 MED ORDER — PREDNISONE 10 MG PO TABS: 10.0000 mg | ORAL_TABLET | Freq: Every day | ORAL | Status: DC

## 2013-07-06 NOTE — Progress Notes (Signed)
Patient ID: Peggy [name], female   DOB: January 31, 1976, 38 y.o.   MRN: [id number]                                                                                                                Reason for Appointment:  Hyperthyroidism, new consultation    History of Present Illness:   Since about the middle of April she has had multiple symptoms as listed below Her main symptoms were feeling weak, tired and sore in her muscles. In the first week of May she was having a feeling of sore throat and a sensation of marbles when she was swallowing. This was more of the right side and radiating to her jaw and more recently this is on the left side She was also having symptoms of her heart racing, shakiness, excessive sweating and nervousness. Despite her appetite being somewhat more than normal she was losing weight more than she was intending Her other symptoms are listed in the review of systems   The patient was evaluated with thyroid function tests which showed the following:    Free T4 was 4.6, TSH <0.01 TSI level normal  No results found for this basename: freeT4, TSH   She also had CBC, vitamin D and basic metabolic panel which were normal except for low vitamin D  She was taking over-the-counter Advil and this has been continued. Also because of her palpitations and shakiness she was given atenolol which is helping her palpitations and shakiness How she continues to feel weak and tired and also some soreness is persisting despite taking Advil Still continues to have some pain in the left side of her throat including on swallowing She is now referred here for further evaluation    Medication List       This list is accurate as of: 07/06/13 11:26 AM.  Always use your most recent med list.               acetaminophen 325 MG tablet  Commonly known as:  TYLENOL  Take 650 mg by mouth daily as needed. Headache     atenolol 50 MG tablet  Commonly known as:  TENORMIN     calcium  carbonate 500 MG chewable tablet  Commonly known as:  TUMS - dosed in mg elemental calcium  Chew 2 tablets by mouth daily as needed. heartburn     cholecalciferol 1000 UNITS tablet  Commonly known as:  VITAMIN D  Take 1,000 Units by mouth daily. Takes 2 tablets daily     ibuprofen 600 MG tablet  Commonly known as:  ADVIL,MOTRIN  Take 1 tablet (600 mg total) by mouth every 6 (six) hours as needed.     multivitamin-prenatal 27-0.8 MG Tabs tablet  Take 1 tablet by mouth daily.     oxyCODONE-acetaminophen 5-325 MG per tablet  Commonly known as:  PERCOCET/ROXICET  Take 1-2 tablets by mouth every 4 (four) hours as needed (moderate - severe pain).  Past Medical History  Diagnosis Date  . Diabetes mellitus     diet controlled  . PONV (postoperative nausea and vomiting)     Past Surgical History  Procedure Laterality Date  . Cesarean section    . Cesarean section  11/06/2011    Procedure: CESAREAN SECTION;  Surgeon: Marylynn Pearson, MD;  Location: [facility];  Service: Obstetrics;  Laterality: N/A;  REPEAT EDC 11/11/11    Family History  Problem Relation Age of Onset  . Hyperlipidemia Other   . Hypertension Other     Social History:  reports that she has never smoked. She does not have any smokeless tobacco history on file. She reports that she does not drink alcohol or use illicit drugs.  Allergies: No Known Allergies  Review of Systems:  Review of Systems  Constitutional: Positive for weight loss and malaise/fatigue. Negative for fever.       Appetite more, sweating, weakness, nause  Eyes: Negative for blurred vision.  Respiratory: Positive for shortness of breath.   Cardiovascular: Positive for palpitations. Negative for chest pain and leg swelling.  Gastrointestinal: Positive for diarrhea.  Genitourinary:       Her last 2 periods were lighter than usual. She had a miscarriage in 2/15  Musculoskeletal: Positive for myalgias.  Neurological: Positive for  tremors.  Psychiatric/Behavioral: The patient is nervous/anxious.        Feels irritable    Has no  history of high blood pressure.         Negative  history of Diabetes.     No swelling of feet         Examination:   BP 102/60  Pulse 97  Temp(Src) 97.8 F (36.6 C)  Resp 12  Ht 5' 4.25" (1.632 m)  Wt 153 lb 9.6 oz (69.673 kg)  BMI 26.16 kg/m2  SpO2 97%  Breastfeeding? No  Repeat pulse = 76.  General Appearance:  well-built and nourished, pleasant, not anxious or hyperkinetic.        Eyes: No excessive prominence or stare. Has mild lid lag present. No swelling of the eyelids  Oral mucosa is normal. Mild tremor of the tongue present Neck: The thyroid is enlarged  Bilaterally but mostly on the left side. There is at least twice normal on the left side, very firm and tender. Mild tenderness and firm texture on the right side also. No distinct nodule present There is no lymphadenopathy .          Heart: normal S1 and S2, no murmurs .         Lungs: breath sounds are clear bilaterally  Extremities: hands are warm. No ankle edema. Neurological: REFLEXES: at biceps are  hyperactive .  TREMORS:  no fine tremors are present..    Assessment/Plan:   Hyperthyroidism, Likely to be from subacute thyroiditis   Discussed with the patient through the causation of subacute thyroiditis, natural history and treatment Since she is still symptomatic and has continued to need Advil 4 times a day will change this to prednisone Will check ESR to confirm the thyroiditis and also free T4 again today Discussed with the patient that she will expect resolution of the thyroiditis within 6-8 weeks and may go through a mildly hypothyroid phase also She can stop the atenolol in about a week Patient understands the above discussion and treatment modalities. All questions were answered satisfactorily   Elayne Snare 07/06/2013, 11:26 AM

## 2013-07-07 NOTE — Progress Notes (Signed)
Quick Note:  Thyroid level improving, test for inflammation is abnormally high as expected, continue prednisone ______

## 2013-07-22 ENCOUNTER — Other Ambulatory Visit (INDEPENDENT_AMBULATORY_CARE_PROVIDER_SITE_OTHER): Payer: Managed Care, Other (non HMO)

## 2013-07-22 DIAGNOSIS — E061 Subacute thyroiditis: Secondary | ICD-10-CM

## 2013-07-22 LAB — TSH: TSH: 0.1 u[IU]/mL — ABNORMAL LOW (ref 0.35–4.50)

## 2013-07-22 LAB — SEDIMENTATION RATE: SED RATE: 15 mm/h (ref 0–22)

## 2013-07-22 LAB — T4, FREE: Free T4: 0.64 ng/dL (ref 0.60–1.60)

## 2013-07-27 ENCOUNTER — Ambulatory Visit (INDEPENDENT_AMBULATORY_CARE_PROVIDER_SITE_OTHER): Payer: Managed Care, Other (non HMO) | Admitting: Endocrinology

## 2013-07-27 ENCOUNTER — Encounter: Payer: Self-pay | Admitting: Endocrinology

## 2013-07-27 VITALS — BP 118/70 | HR 63 | Temp 98.3°F | Resp 14 | Ht 64.25 in | Wt 155.8 lb

## 2013-07-27 DIAGNOSIS — E061 Subacute thyroiditis: Secondary | ICD-10-CM

## 2013-07-27 NOTE — Patient Instructions (Signed)
Prednisone 1/2 daily for 1 week then stop

## 2013-07-27 NOTE — Progress Notes (Signed)
Patient ID: Peggy [name], female   DOB: [birth year], 38 y.o.   MRN: [id number]                                                                                                                Reason for Appointment:  Hyperthyroidism, new consultation    History of Present Illness:   She was seen or subacute thyroiditis in 5/15 and she had following typical symptoms: Her main symptoms were feeling weak, tired and sore in her muscles. In the first week of May she was having a feeling of sore throat and a sensation of marbles when she was swallowing. This was more of the right side and radiating to her jaw and more recently this is on the left side She was also having symptoms of her heart racing, shakiness, excessive sweating and nervousness. Despite her appetite being somewhat more than normal she was losing weight more than she was intending  She had a high ESR of 41 and was started on prednisone 30 mg which has been tapered down to 10 mg a day for the last week The patient has been feeling much better with all her symptoms and has only mild fatigue. Has no further heat intolerance or palpitations. Has gained back 2 pounds She has had no side effects from prednisone although feels a little puffy today and has no constipation    Lab Results  Component Value Date   FREET4 0.64 07/22/2013   FREET4 1.97* 07/06/2013   Appointment on 07/22/2013  Component Date Value Ref Range Status  . TSH 07/22/2013 0.10* 0.35 - 4.50 uIU/mL Final  . Free T4 07/22/2013 0.64  0.60 - 1.60 ng/dL Final  . Sed Rate 07/22/2013 15  0 - 22 mm/hr Final    She also had CBC, vitamin D and basic metabolic panel which were normal except for low vitamin D  She was taking over-the-counter Advil and this has been continued. Also because of her palpitations and shakiness she was given atenolol which is helping her palpitations and shakiness How she continues to feel weak and tired and also some soreness is persisting  despite taking Advil Still continues to have some pain in the left side of her throat including on swallowing She is now referred here for further evaluation    Medication List       This list is accurate as of: 07/27/13 11:22 AM.  Always use your most recent med list.               acetaminophen 325 MG tablet  Commonly known as:  TYLENOL  Take 650 mg by mouth daily as needed. Headache     atenolol 50 MG tablet  Commonly known as:  TENORMIN     calcium carbonate 500 MG chewable tablet  Commonly known as:  TUMS - dosed in mg elemental calcium  Chew 2 tablets by mouth daily as needed. heartburn     cholecalciferol 1000 UNITS tablet  Commonly known as:  VITAMIN D  Take 1,000 Units by mouth daily. Takes 2 tablets daily     ibuprofen 600 MG tablet  Commonly known as:  ADVIL,MOTRIN  Take 1 tablet (600 mg total) by mouth every 6 (six) hours as needed.     predniSONE 10 MG tablet  Commonly known as:  DELTASONE  Take 1 tablet (10 mg total) by mouth daily with breakfast. Take 3 tablets in a.m. for 3 days, then 2 tablets daily for 7 days and then one tablet daily. One month supply            Past Medical History  Diagnosis Date  . Diabetes mellitus     diet controlled  . PONV (postoperative nausea and vomiting)     Past Surgical History  Procedure Laterality Date  . Cesarean section    . Cesarean section  11/06/2011    Procedure: CESAREAN SECTION;  Surgeon: Marylynn Pearson, MD;  Location: [facility];  Service: Obstetrics;  Laterality: N/A;  REPEAT EDC 11/11/11    Family History  Problem Relation Age of Onset  . Hyperlipidemia Other   . Hypertension Other     Social History:  reports that she has never smoked. She has never used smokeless tobacco. She reports that she does not drink alcohol or use illicit drugs.  Allergies: No Known Allergies  Review of Systems:  ROS  Has no  history of high blood pressure.             No swelling of feet         Examination:     BP 118/70  Pulse 63  Temp(Src) 98.3 F (36.8 C)  Resp 14  Ht 5' 4.25" (1.632 m)  Wt 155 lb 12.8 oz (70.67 kg)  BMI 26.53 kg/m2  SpO2 96%    General Appearance:  well-built and nourished, pleasant,  Neck: The thyroid is nonpalpable; there is a slight fullness on the right side with mild tenderness Neurological: REFLEXES: at biceps are normal .  TREMORS:  not present   Assessment/Plan:    Recent subacute thyroiditis now resolving She has no further hyperthyroid symptoms and her thyroid exam is quite normal today She has no local tenderness and has tapered down her prednisone to 10 mg She may be getting minimal symptoms of hypothyroidism with her free T4 low normal Explained to the patient that this is usually transient and often does not require any thyroid supplementation  For now will give her 5 mg prednisone for the next week and then stop She will have a followup in one month Also advised her to call if she has any new symptoms or excessive fatigue  KUMAR,AJAY 07/27/2013, 11:22 AM

## 2013-08-18 ENCOUNTER — Other Ambulatory Visit (INDEPENDENT_AMBULATORY_CARE_PROVIDER_SITE_OTHER): Payer: Managed Care, Other (non HMO)

## 2013-08-18 DIAGNOSIS — E061 Subacute thyroiditis: Secondary | ICD-10-CM

## 2013-08-18 LAB — TSH: TSH: 4.29 u[IU]/mL (ref 0.35–4.50)

## 2013-08-18 LAB — T4, FREE: Free T4: 0.63 ng/dL (ref 0.60–1.60)

## 2013-08-24 ENCOUNTER — Ambulatory Visit (INDEPENDENT_AMBULATORY_CARE_PROVIDER_SITE_OTHER): Payer: Managed Care, Other (non HMO) | Admitting: Endocrinology

## 2013-08-24 ENCOUNTER — Encounter: Payer: Self-pay | Admitting: Endocrinology

## 2013-08-24 VITALS — BP 111/66 | HR 67 | Temp 97.7°F | Resp 14 | Ht 64.25 in | Wt 159.4 lb

## 2013-08-24 DIAGNOSIS — E061 Subacute thyroiditis: Secondary | ICD-10-CM

## 2013-08-24 MED ORDER — LEVOTHYROXINE SODIUM 50 MCG PO TABS: 50.0000 ug | ORAL_TABLET | Freq: Every day | ORAL | Status: DC

## 2013-08-24 NOTE — Patient Instructions (Signed)
Synthroid 50ug daily for 30 days then stop

## 2013-08-24 NOTE — Progress Notes (Addendum)
Patient ID: Peggy [name], female   DOB: November 13, 1975, 38 y.o.   MRN: [id number]                                                                                                                Reason for Appointment:  Followup of thyroid    History of Present Illness:   She was seen for subacute thyroiditis in 06/2013 and she had following typical symptoms: In the first week of May she was having a feeling of sore throat and a sensation of marbles when she was swallowing.  Her main symptoms were feeling weak, tired and sore in her muscles. She was also having symptoms of her heart racing, shakiness, excessive sweating and nervousness. Despite her appetite being somewhat more than normal she was losing weight more than she was intending  She had a high ESR of 41 and was started on prednisone 30 mg which has been tapered down to 10 mg a day and stopped in late June: She thinks she only took 20 mg when first starting the medication. Her neck pain and other symptoms resolved  The patient has been feeling more fatigued since her last visit and feels like she can sleep longer to get more rest No change in mild chronic cold intolerance, no skin or hair changes   She thinks she has gained some weight because of stress eating    Lab Results  Component Value Date   FREET4 0.63 08/18/2013   FREET4 0.64 07/22/2013   FREET4 1.97* 07/06/2013   Appointment on 08/18/2013  Component Date Value Ref Range Status  . TSH 08/18/2013 4.29  0.35 - 4.50 uIU/mL Final  . Free T4 08/18/2013 0.63  0.60 - 1.60 ng/dL Final       Medication List       This list is accurate as of: 08/24/13 10:56 AM.  Always use your most recent med list.               acetaminophen 325 MG tablet  Commonly known as:  TYLENOL  Take 650 mg by mouth daily as needed. Headache     atenolol 50 MG tablet  Commonly known as:  TENORMIN     calcium carbonate 500 MG chewable tablet  Commonly known as:  TUMS - dosed in mg  elemental calcium  Chew 2 tablets by mouth daily as needed. heartburn     cholecalciferol 1000 UNITS tablet  Commonly known as:  VITAMIN D  Take 1,000 Units by mouth daily. Takes 2 tablets daily     ibuprofen 600 MG tablet  Commonly known as:  ADVIL,MOTRIN  Take 1 tablet (600 mg total) by mouth every 6 (six) hours as needed.     predniSONE 10 MG tablet  Commonly known as:  DELTASONE  Take 1 tablet (10 mg total) by mouth daily with breakfast. Take 3 tablets in a.m. for 3 days, then 2 tablets daily for 7 days and then one tablet daily. One month supply  Past Medical History  Diagnosis Date  . Diabetes mellitus     diet controlled  . PONV (postoperative nausea and vomiting)     Past Surgical History  Procedure Laterality Date  . Cesarean section    . Cesarean section  11/06/2011    Procedure: CESAREAN SECTION;  Surgeon: Marylynn Pearson, MD;  Location: [facility];  Service: Obstetrics;  Laterality: N/A;  REPEAT EDC 11/11/11    Family History  Problem Relation Age of Onset  . Hyperlipidemia Other   . Hypertension Other     Social History:  reports that she has never smoked. She has never used smokeless tobacco. She reports that she does not drink alcohol or use illicit drugs.  Allergies: No Known Allergies  Review of Systems:  ROS  Has no  history of high blood pressure.                  Psoriasis present   Examination:   BP 111/66  Pulse 67  Temp(Src) 97.7 F (36.5 C)  Resp 14  Ht 5' 4.25" (1.632 m)  Wt 159 lb 6.4 oz (72.303 kg)  BMI 27.15 kg/m2  SpO2 98%    General Appearance:  well-built and nourished, pleasant,  Neck: The thyroid is nonpalpable and no local tenderness  REFLEXES: at biceps are normal    Assessment/Plan:    Recent subacute thyroiditis, resolved She does not have any thyroid enlargement now and no local tenderness However she appears to be having some fatigue with low normal free T4 levels again Not clear if her fatigue is  related to transient hypothyroidism but appears to have more symptoms since her last visit  For now will give her 50 mcg of levothyroxine for the next one month and then stop She will have a followup in 6 weeks Also advised her to call if she has any new symptoms or excessive fatigue  Lindsey Demonte 08/24/2013, 10:56 AM

## 2013-09-23 ENCOUNTER — Telehealth: Payer: Self-pay | Admitting: Endocrinology

## 2013-09-23 NOTE — Telephone Encounter (Signed)
[place] is still to continue to take the med

## 2013-09-23 NOTE — Telephone Encounter (Signed)
Patient was informed to take her synthroid for 30 days then stop per Dr. Lucianne MussKumar.

## 2013-09-30 ENCOUNTER — Other Ambulatory Visit (INDEPENDENT_AMBULATORY_CARE_PROVIDER_SITE_OTHER): Payer: Managed Care, Other (non HMO)

## 2013-09-30 DIAGNOSIS — E061 Subacute thyroiditis: Secondary | ICD-10-CM

## 2013-10-01 LAB — TSH: TSH: 2.43 u[IU]/mL (ref 0.35–4.50)

## 2013-10-01 LAB — T4, FREE: FREE T4: 0.95 ng/dL (ref 0.60–1.60)

## 2013-10-05 ENCOUNTER — Encounter: Payer: Self-pay | Admitting: Endocrinology

## 2013-10-05 ENCOUNTER — Ambulatory Visit (INDEPENDENT_AMBULATORY_CARE_PROVIDER_SITE_OTHER): Payer: Managed Care, Other (non HMO) | Admitting: Endocrinology

## 2013-10-05 VITALS — BP 100/63 | HR 70 | Temp 97.6°F | Resp 14 | Ht 64.25 in | Wt 160.2 lb

## 2013-10-05 DIAGNOSIS — E039 Hypothyroidism, unspecified: Secondary | ICD-10-CM

## 2013-10-05 NOTE — Progress Notes (Signed)
Patient ID: Peggy [name], female   DOB: 1975/06/16, 38 y.o.   MRN: [id number]                                                                                                                Reason for Appointment:  Followup of thyroid    History of Present Illness:   She was seen for subacute thyroiditis in 06/2013 and she had following typical symptoms: In the first week of May she was having a feeling of sore throat and a sensation of marbles when she was swallowing.  Her main symptoms were feeling weak, tired and sore in her muscles. She was also having symptoms of her heart racing, shakiness, excessive sweating and nervousness. Despite her appetite being somewhat more than normal she was losing weight more than she was intending  She had a high ESR of 41 and was started on prednisone 30 mg which was tapered down to 10 mg a day and stopped in late June  Her neck pain and other symptoms resolved along with thyroid levels improving to normal  The patient had been feeling more fatigued on her last visit and her free T4 level is low normal She had no change in mild chronic cold intolerance, no skin or hair changes She was given a trial of 50 mcg of levothyroxine for 30 days and she felt better with this  She is now complaining of some hair loss and over the last week or so she has been feeling more tired but has had other things going on that could make her tired     Lab Results  Component Value Date   FREET4 0.95 09/30/2013   FREET4 0.63 08/18/2013   FREET4 0.64 07/22/2013   Appointment on 09/30/2013  Component Date Value Ref Range Status  . Free T4 09/30/2013 0.95  0.60 - 1.60 ng/dL Final  . TSH 09/30/2013 2.43  0.35 - 4.50 uIU/mL Final       Medication List       This list is accurate as of: 10/05/13 10:30 AM.  Always use your most recent med list.               acetaminophen 325 MG tablet  Commonly known as:  TYLENOL  Take 650 mg by mouth daily as needed. Headache       atenolol 50 MG tablet  Commonly known as:  TENORMIN     calcium carbonate 500 MG chewable tablet  Commonly known as:  TUMS - dosed in mg elemental calcium  Chew 2 tablets by mouth daily as needed. heartburn     cholecalciferol 1000 UNITS tablet  Commonly known as:  VITAMIN D  Take 1,000 Units by mouth daily. Takes 2 tablets daily     ibuprofen 600 MG tablet  Commonly known as:  ADVIL,MOTRIN  Take 1 tablet (600 mg total) by mouth every 6 (six) hours as needed.     ketoconazole 2 % shampoo  Commonly known as:  NIZORAL  levothyroxine 50 MCG tablet  Commonly known as:  SYNTHROID, LEVOTHROID  Take 1 tablet (50 mcg total) by mouth daily.            Past Medical History  Diagnosis Date  . Diabetes mellitus     diet controlled  . PONV (postoperative nausea and vomiting)     Past Surgical History  Procedure Laterality Date  . Cesarean section    . Cesarean section  11/06/2011    Procedure: CESAREAN SECTION;  Surgeon: Marylynn Pearson, MD;  Location: [facility];  Service: Obstetrics;  Laterality: N/A;  REPEAT EDC 11/11/11    Family History  Problem Relation Age of Onset  . Hyperlipidemia Other   . Hypertension Other     Social History:  reports that she has never smoked. She has never used smokeless tobacco. She reports that she does not drink alcohol or use illicit drugs.  Allergies: No Known Allergies  Review of Systems:  ROS  Wt Readings from Last 3 Encounters:  10/05/13 160 lb 3.2 oz (72.666 kg)  08/24/13 159 lb 6.4 oz (72.303 kg)  07/27/13 155 lb 12.8 oz (70.67 kg)   Has no  history of high blood pressure.                  Psoriasis present  Menses 25-26 days   Examination:   BP 100/63  Pulse 70  Temp(Src) 97.6 F (36.4 C)  Resp 14  Ht 5' 4.25" (1.632 m)  Wt 160 lb 3.2 oz (72.666 kg)  BMI 27.28 kg/m2  SpO2 98%    General Appearance:  well-looking, pleasant  Neck: The thyroid is non-palpable and no local tenderness  REFLEXES: at biceps are  normal    Assessment/Plan:  History of subacute thyroiditis, resolved She does not have any thyroid enlargement  Her thyroid levels are back to normal including free T4 Explained to her that hair loss may be related to her fluctuation in thyroid levels Her fatigue is unlikely to be related to hypothyroidism since her levels are back to normal now without the supplement  She will be seen as needed and call if she has worsening fatigue for no other reason  Shoichi Mielke 10/05/2013, 10:30 AM

## 2013-12-12 ENCOUNTER — Encounter: Payer: Self-pay | Admitting: Endocrinology

## 2014-06-15 LAB — OB RESULTS CONSOLE RPR: RPR: NONREACTIVE

## 2014-06-15 LAB — OB RESULTS CONSOLE ABO/RH: RH Type: POSITIVE

## 2014-06-15 LAB — OB RESULTS CONSOLE HIV ANTIBODY (ROUTINE TESTING): HIV: NONREACTIVE

## 2014-06-15 LAB — OB RESULTS CONSOLE GC/CHLAMYDIA
CHLAMYDIA, DNA PROBE: NEGATIVE
GC PROBE AMP, GENITAL: NEGATIVE

## 2014-06-15 LAB — OB RESULTS CONSOLE HEPATITIS B SURFACE ANTIGEN: HEP B S AG: NEGATIVE

## 2014-06-15 LAB — OB RESULTS CONSOLE ANTIBODY SCREEN: ANTIBODY SCREEN: NEGATIVE

## 2014-06-15 LAB — OB RESULTS CONSOLE RUBELLA ANTIBODY, IGM: RUBELLA: IMMUNE

## 2014-07-12 LAB — US OB FOLLOW UP

## 2014-07-20 ENCOUNTER — Other Ambulatory Visit (HOSPITAL_COMMUNITY): Payer: Self-pay | Admitting: Obstetrics and Gynecology

## 2014-07-20 DIAGNOSIS — O289 Unspecified abnormal findings on antenatal screening of mother: Secondary | ICD-10-CM

## 2014-07-24 ENCOUNTER — Ambulatory Visit (HOSPITAL_COMMUNITY)
Admission: RE | Admit: 2014-07-24 | Discharge: 2014-07-24 | Disposition: A | Discharge status: Home / Self Care | Payer: Managed Care, Other (non HMO) | Source: Ambulatory Visit | Attending: Obstetrics and Gynecology | Admitting: Obstetrics and Gynecology

## 2014-07-24 DIAGNOSIS — O09529 Supervision of elderly multigravida, unspecified trimester: Secondary | ICD-10-CM

## 2014-07-24 DIAGNOSIS — O280 Abnormal hematological finding on antenatal screening of mother: Secondary | ICD-10-CM

## 2014-07-27 ENCOUNTER — Encounter (HOSPITAL_COMMUNITY): Payer: Self-pay

## 2014-07-27 DIAGNOSIS — O09529 Supervision of elderly multigravida, unspecified trimester: Secondary | ICD-10-CM | POA: Insufficient documentation

## 2014-07-27 DIAGNOSIS — O280 Abnormal hematological finding on antenatal screening of mother: Secondary | ICD-10-CM | POA: Insufficient documentation

## 2014-07-27 NOTE — Progress Notes (Signed)
Genetic Counseling  High-Risk Gestation Note  Appointment Date:  07/27/2014 Referred By: Zelphia Cairo, MD Date of Birth:  20-Apr-1975 Partner:  Peggy [name]   Pregnancy History: [id number] Estimated Date of Delivery: 01/23/15 Estimated Gestational Age: [redacted]w[redacted]d Attending: Alpha Gula, MD   Peggy [name] and her husband, Mr. Taletha Twiford, were seen for genetic counseling because of a maternal age of 39 y.o. and screen positive Down syndrome risk from First trimester screening through [facility].      In summary:  Discussed age-related risks for fetal aneuploidy  Peggy [name]'s first trimester screen indicated 1 in 173 Down syndrome risk, but this is reduced from her age-related risk of 1 in 37  Patient and partner would like to first pursue detailed ultrasound, scheduled 08/23/14 in our office  They may consider pursuing NIPS  (Panorama through [facility] laboratory) following their detailed ultrasound  Patient declined amniocentesis   They were counseled regarding maternal age and the association with risk for chromosome conditions due to nondisjunction with aging of the ova.   We reviewed chromosomes, nondisjunction, and the associated 1 in 23 risk for fetal aneuploidy related to a maternal age of 39 y.o. at [redacted]w[redacted]d gestation.  They were counseled that the risk for aneuploidy decreases as gestational age increases, accounting for those pregnancies which spontaneously abort.  We specifically discussed Down syndrome (trisomy 13), trisomies 22 and 68, and sex chromosome aneuploidies (47,XXX and 47,XXY) including the common features and prognoses of each.   They were counseled regarding the First trimester screen result and the associated decreased risk for fetal Down syndrome 1 in 67 to 1 in 173. However, since 1 in 173 (0.6%) is still above the screen's cutoff, this is considered a "screen positive" result.  In addition, we reviewed the screen adjusted reduction in risks for  trisomy 18/13 (1 in 123 to less than 1 in 10,000).  We also discussed other explanations for a screen positive result including: differences in maternal metabolism and normal variation. They understand that this screening is not diagnostic for these conditions but provides a risk assessment.  We reviewed additional available screening options including noninvasive prenatal screening (NIPS)/cell free DNA (cfDNA) testing and detailed ultrasound.  They were counseled that screening tests are used to modify a patient's a priori risk for aneuploidy, typically based on age. This estimate provides a pregnancy specific risk assessment. We reviewed the benefits and limitations of each option. Specifically, we discussed the conditions for which each test screens, the detection rates, and false positive rates of each. They specifically inquired about billing for NIPS. They were counseled regarding the expense of NIPS, and that approximately $800 is billed to the insurance provider., and that their insurance provider is currently in-network with the laboratory performing this test Peggy [name]).  The amount they will be responsible for, out of pocket, depends upon the  specific plan and is subject to co-pay, co-insurance and/or deductible. They were also counseled regarding diagnostic testing via amniocentesis. We reviewed the approximate 1 in 300-500 risk for complications for amniocentesis, including spontaneous pregnancy loss.   After consideration of all the options, they elected to proceed first with detailed ultrasound, which is scheduled for 08/23/14. They may consider NIPS further pending results of detailed ultrasound. The couple declined amniocentesis given the associated risk of complications and given that they would not change their pregnancy course in the case of the presence of Down syndrome.  They understand that screening tests cannot rule out all birth defects or genetic  syndromes. The patient was advised of  this limitation and states she still does not want additional testing at this time.   Peggy [name] was provided with written information regarding cystic fibrosis (CF) including the carrier frequency and incidence in the Caucasian population, the availability of carrier testing and prenatal diagnosis if indicated.  In addition, we discussed that CF is routinely screened for as part of the [place] newborn screening panel.  She declined CF testing today.   Both family histories were reviewed and found to be contributory for history of four first trimester spontaneous abortions for the couple, in addition to their two healthy daughters. Peggy [name] reported that she had a normal thrombophilia workup through her OB. Approximately 1 in 6 confirmed pregnancies results in miscarriage. A single underlying cause is more likely to be suspected when a couple has experienced 3 or more losses. It is less likely that there will be an identifiable single underlying cause when a couple has experienced less than 3 losses. We discussed that there are several possible causes and that a cause is not determine for approximately half of couples with this history. We specifically discussed the chance for an underlying chromosome variant.  In approximately 3-8% of couples with recurrent pregnancy loss, one partner carries a chromosome variant, such as a balanced translocation. Being a carrier of a chromosome variant can increase the risk for abnormalities in the sperm or egg cell, which can increase the risk for miscarriage or the birth of a child with birth defects and/or intellectual disability. Peggy [name] and her partner declined peripheral chromosome analysis at this time.    Peggy [name] also reported a maternal great-uncle with epilepsy who died as a child. Epilepsy occurs in approximately 1% of the population and can have many causes.  Approximately 80% of epilepsy is thought to be idiopathic while the remaining 20% is  secondary to a variety of factors such as perinatal events, infections, trauma and genetic disease.  A specific diagnosis in an affected individual is necessary to accurately assess the risk for other family members to develop epilepsy.  In the absence of a known etiology, epilepsy is thought to be caused by a combination of genetic and environmental factors, called multifactorial inheritance. Recurrence risk for epilepsy is estimated to be 4% for offspring of an individual with primary idiopathic epilepsy. Given this reported family history and the degree of relation, recurrence risk for the current pregnancy would not be expected to be increased above the general population risk. Without further information regarding the provided family history, an accurate genetic risk cannot be calculated. Further genetic counseling is warranted if more information is obtained.  Peggy [name] denied exposure to environmental toxins or chemical agents. She denied the use of alcohol, tobacco or street drugs. She denied significant viral illnesses during the course of her pregnancy. Her medical and surgical histories were noncontributory.   I counseled this couple regarding the above risks and available options.  The approximate face-to-face time with the genetic counselor was 50 minutes.  Quinn Plowman, MS,  Certified Genetic Counselor 07/27/2014

## 2014-07-28 ENCOUNTER — Other Ambulatory Visit (HOSPITAL_COMMUNITY): Payer: Self-pay | Admitting: Obstetrics and Gynecology

## 2014-07-28 ENCOUNTER — Encounter (HOSPITAL_COMMUNITY): Payer: Self-pay | Admitting: Obstetrics and Gynecology

## 2014-08-11 ENCOUNTER — Encounter: Payer: Self-pay | Admitting: Cardiovascular Disease

## 2014-08-11 ENCOUNTER — Ambulatory Visit (INDEPENDENT_AMBULATORY_CARE_PROVIDER_SITE_OTHER): Payer: Managed Care, Other (non HMO) | Admitting: Cardiovascular Disease

## 2014-08-11 VITALS — BP 98/62 | HR 70 | Ht 64.25 in | Wt 180.0 lb

## 2014-08-11 DIAGNOSIS — R002 Palpitations: Secondary | ICD-10-CM | POA: Diagnosis not present

## 2014-08-11 DIAGNOSIS — I341 Nonrheumatic mitral (valve) prolapse: Secondary | ICD-10-CM

## 2014-08-11 NOTE — Progress Notes (Signed)
Cardiology Office Note   Date:  08/11/2014   ID:  Peggy BlackbirdSarah P Bartosiewicz, DOB February 20, 1975, MRN [id number]  PCP:  Tommy RainwaterShamleffer, Ibethal JARALLA, MD  Cardiologist:   Vesta MixerNahser, Amjad Fikes J, MD   Chief Complaint  Patient presents with  . New Evaluation    palpitations   Problem List: 1. Pregnancy 2. Palpitation s   History of Present Illness: Peggy [name] is a 39 y.o. female who presents for her palpitations. She describes a sensation of heart racing .  Has had a stomach bug for the past week or so . May be a bit dehydrated   she is currently [redacted] weeks pregnant. . Has then several times a week.   The palpitations can last a few minutes.  Thinks her HR is fast.   Not associated with any chest pain or dyspnea.  Has had them even before her pregnancy but not as frequent   Past Medical History  Diagnosis Date  . Diabetes mellitus     diet controlled  . PONV (postoperative nausea and vomiting)     Past Surgical History  Procedure Laterality Date  . Cesarean section    . Cesarean section  11/06/2011    Procedure: CESAREAN SECTION;  Surgeon: Zelphia CairoGretchen Adkins, MD;  Location: [facility];  Service: Obstetrics;  Laterality: N/A;  REPEAT EDC 11/11/11     Current Outpatient Prescriptions  Medication Sig Dispense Refill  . acetaminophen (TYLENOL) 325 MG tablet Take 650 mg by mouth daily as needed. Headache    . calcium carbonate (TUMS - DOSED IN MG ELEMENTAL CALCIUM) 500 MG chewable tablet Chew 2 tablets by mouth daily as needed. heartburn    . cholecalciferol (VITAMIN D) 1000 UNITS tablet Take 1,000 Units by mouth daily. Takes 2 tablets daily    . ibuprofen (ADVIL,MOTRIN) 600 MG tablet Take 1 tablet (600 mg total) by mouth every 6 (six) hours as needed. 30 tablet 2  . atenolol (TENORMIN) 50 MG tablet      No current facility-administered medications for this visit.    Allergies:   Review of patient's allergies indicates no known allergies.    Social History:  The patient  reports that she  has never smoked. She has never used smokeless tobacco. She reports that she does not drink alcohol or use illicit drugs.   Family History:  The patient's family history includes Hyperlipidemia in her father, mother, and other; Hypertension in her mother and other; Prostate cancer in her father.    ROS:  Please see the history of present illness.    Review of Systems: Constitutional:  denies fever, chills, diaphoresis, appetite change and fatigue.  HEENT: denies photophobia, eye pain, redness, hearing loss, ear pain, congestion, sore throat, rhinorrhea, sneezing, neck pain, neck stiffness and tinnitus.  Respiratory: denies SOB, DOE, cough, chest tightness, and wheezing.  Cardiovascular: denies chest pain, palpitations and leg swelling.  Gastrointestinal: denies nausea, vomiting, abdominal pain, diarrhea, constipation, blood in stool.  Genitourinary: denies dysuria, urgency, frequency, hematuria, flank pain and difficulty urinating.  Musculoskeletal: denies  myalgias, back pain, joint swelling, arthralgias and gait problem.   Skin: denies pallor, rash and wound.  Neurological: denies dizziness, seizures, syncope, weakness, light-headedness, numbness and headaches.   Hematological: denies adenopathy, easy bruising, personal or family bleeding history.  Psychiatric/ Behavioral: denies suicidal ideation, mood changes, confusion, nervousness, sleep disturbance and agitation.       All other systems are reviewed and negative.    PHYSICAL EXAM: VS:  BP 98/62 mmHg  Pulse 70  Ht 5' 4.25" (1.632 m)  Wt 81.647 kg (180 lb)  BMI 30.65 kg/m2  LMP 04/18/2014 (LMP Unknown) , BMI Body mass index is 30.65 kg/(m^2). GEN: Well nourished, well developed, in no acute distress HEENT: normal Neck: no JVD, carotid bruits, or masses Cardiac: RRR; no murmurs, rubs, or gallops,no edema  Respiratory:  clear to auscultation bilaterally, normal work of breathing GI: soft, nontender, nondistended, + BS MS:  no deformity or atrophy Skin: warm and dry, no rash Neuro:  Strength and sensation are intact Psych: normal   EKG:  EKG is ordered today. The ekg ordered today demonstrates :  NSR at 70 .  Normal ECG    Recent Labs: 09/30/2013: TSH 2.43    Lipid Panel No results found for: CHOL, TRIG, HDL, CHOLHDL, VLDL, LDLCALC, LDLDIRECT    Wt Readings from Last 3 Encounters:  08/11/14 81.647 kg (180 lb)  10/05/13 72.666 kg (160 lb 3.2 oz)  08/24/13 72.303 kg (159 lb 6.4 oz)      Other studies Reviewed: Additional studies/ records that were reviewed today include: . Review of the above records demonstrates:    ASSESSMENT AND PLAN:  1.  Palpitations - she has palpitations that sound like PVCs or perhaps PACs.  There are no associated symptoms  - no CP or dyspnea. I have told her that I think these are completely benign.  I offered to order a 30 day monitor but at this point, I do not think it is absolutely necessary .  She will call us back if they worsen .   2. Mitral valve prolapse:  She has MVP on exam.  No significant murmur. I think that she needs an echo at some point but the timing is not critical .  Will follow up with me as needed    Current medicines are reviewed at length with the patient today.  The patient does not have concerns regarding medicines.  The following changes have been made:  no change  Labs/ tests ordered today include:  No orders of the defined types were placed in this encounter.     Disposition:   FU with me as needed.      Makalya Nave, Deloris Ping, MD  08/11/2014 10:34 AM    [facility] [address] [place], [place], [place]  [address] Phone: [phone]; Fax: [phone]   [facility]  [address] [place], [place]  [address] [phone]    Fax [phone]

## 2014-08-11 NOTE — Patient Instructions (Signed)
Medication Instructions:  Your physician recommends that you continue on your current medications as directed. Please refer to the Current Medication list given to you today.   Labwork: None Ordered   Testing/Procedures: Your physician has requested that you have an echocardiogram. Echocardiography is a painless test that uses sound waves to create images of your heart. It provides your doctor with information about the size and shape of your heart and how well your heart's chambers and valves are working. This procedure takes approximately one hour. There are no restrictions for this procedure.   Follow-Up: Your physician recommends that you schedule a follow-up appointment in: as needed with Dr. Elease HashimotoNahser (if palpitations worsen or you decide to get an echo)

## 2014-08-23 ENCOUNTER — Ambulatory Visit (HOSPITAL_COMMUNITY)
Admission: RE | Admit: 2014-08-23 | Discharge: 2014-08-23 | Disposition: A | Discharge status: Home / Self Care | Payer: Managed Care, Other (non HMO) | Source: Ambulatory Visit | Attending: Obstetrics and Gynecology | Admitting: Obstetrics and Gynecology

## 2014-08-23 DIAGNOSIS — Z3A00 Weeks of gestation of pregnancy not specified: Secondary | ICD-10-CM | POA: Diagnosis not present

## 2014-08-23 DIAGNOSIS — O283 Abnormal ultrasonic finding on antenatal screening of mother: Secondary | ICD-10-CM | POA: Insufficient documentation

## 2014-08-23 DIAGNOSIS — O289 Unspecified abnormal findings on antenatal screening of mother: Secondary | ICD-10-CM

## 2014-08-23 DIAGNOSIS — O09529 Supervision of elderly multigravida, unspecified trimester: Secondary | ICD-10-CM | POA: Insufficient documentation

## 2014-08-23 DIAGNOSIS — Z3689 Encounter for other specified antenatal screening: Secondary | ICD-10-CM | POA: Insufficient documentation

## 2014-08-23 DIAGNOSIS — Z3A40 40 weeks gestation of pregnancy: Secondary | ICD-10-CM | POA: Insufficient documentation

## 2014-08-23 DIAGNOSIS — O281 Abnormal biochemical finding on antenatal screening of mother: Secondary | ICD-10-CM | POA: Insufficient documentation

## 2015-01-02 NOTE — H&P (Addendum)
Peggy BlackbirdSarah P [name] is a 39 y.o. female presenting for repeat c-section with sterilization.  History OB History    Gravida Para Term Preterm AB TAB SAB Ectopic Multiple Living   7 2 2  4  4   2      Past Medical History  Diagnosis Date  . Diabetes mellitus     diet controlled  . PONV (postoperative nausea and vomiting)    Past Surgical History  Procedure Laterality Date  . Cesarean section    . Cesarean section  11/06/2011    Procedure: CESAREAN SECTION;  Surgeon: Zelphia CairoGretchen Merisa Julio, MD;  Location: [facility];  Service: Obstetrics;  Laterality: N/A;  REPEAT EDC 11/11/11   Family History: family history includes Hyperlipidemia in her father, mother, and other; Hypertension in her mother and other; Prostate cancer in her father. Social History:  reports that she has never smoked. She has never used smokeless tobacco. She reports that she does not drink alcohol or use illicit drugs.   Prenatal Transfer Tool  Maternal Diabetes: No Genetic Screening: Abnormal:  Results: Elevated risk of Trisomy 21 Maternal Ultrasounds/Referrals: Normal Fetal Ultrasounds or other Referrals:  None Maternal Substance Abuse:  No Significant Maternal Medications:  None Significant Maternal Lab Results:  None Other Comments:  None  ROS    Last menstrual period 04/18/2014. Exam Physical Exam  AF, VSS Gen - NAD ABd - gravid, NT Ext - NT, no edema Prenatal labs: ABO, Rh:   Antibody:   Rubella:   RPR:    HBsAg:    HIV:    GBS:     Assessment/Plan: Repeat c-section with BPS R/b/a discussed, questions answered, informed consent   Peggy [name] 01/02/2015, 5:08 PM

## 2015-01-16 ENCOUNTER — Encounter (HOSPITAL_COMMUNITY): Payer: Self-pay

## 2015-01-16 ENCOUNTER — Other Ambulatory Visit (HOSPITAL_COMMUNITY): Payer: Managed Care, Other (non HMO)

## 2015-01-16 ENCOUNTER — Encounter (HOSPITAL_COMMUNITY)
Admission: RE | Admit: 2015-01-16 | Discharge: 2015-01-16 | Disposition: A | Discharge status: Home / Self Care | Payer: Managed Care, Other (non HMO) | Source: Ambulatory Visit | Attending: Obstetrics and Gynecology | Admitting: Obstetrics and Gynecology

## 2015-01-16 VITALS — BP 117/75 | HR 86 | Temp 97.9°F | Resp 18 | Ht 65.0 in | Wt 210.0 lb

## 2015-01-16 DIAGNOSIS — O280 Abnormal hematological finding on antenatal screening of mother: Secondary | ICD-10-CM

## 2015-01-16 DIAGNOSIS — O09529 Supervision of elderly multigravida, unspecified trimester: Secondary | ICD-10-CM

## 2015-01-16 HISTORY — DX: Major depressive disorder, single episode, unspecified: F32.9

## 2015-01-16 HISTORY — DX: Thyrotoxicosis, unspecified without thyrotoxic crisis or storm: E05.90

## 2015-01-16 HISTORY — DX: Headache: R51

## 2015-01-16 HISTORY — DX: Cardiac murmur, unspecified: R01.1

## 2015-01-16 HISTORY — DX: Headache, unspecified: R51.9

## 2015-01-16 HISTORY — DX: Depression, unspecified: F32.A

## 2015-01-16 LAB — CBC
HEMATOCRIT: 36.7 % (ref 36.0–46.0)
HEMOGLOBIN: 12.4 g/dL (ref 12.0–15.0)
MCH: 27.7 pg (ref 26.0–34.0)
MCHC: 33.8 g/dL (ref 30.0–36.0)
MCV: 81.9 fL (ref 78.0–100.0)
Platelets: 206 10*3/uL (ref 150–400)
RBC: 4.48 MIL/uL (ref 3.87–5.11)
RDW: 14.1 % (ref 11.5–15.5)
WBC: 9.9 10*3/uL (ref 4.0–10.5)

## 2015-01-16 LAB — TYPE AND SCREEN
ABO/RH(D): O POS
Antibody Screen: NEGATIVE

## 2015-01-16 NOTE — Patient Instructions (Addendum)
Your procedure is scheduled on: January 18, 2015   [facility] at: 11:30 am   Pick up the phone at the desk and dial [phone].  Call this number if you have problems the morning of surgery: [phone].  Remember: Do NOT eat food: after midnight on Wednesday  Do NOT drink clear liquids after: 9:00 am day of surgery  Take these medicines the morning of surgery with a SIP OF WATER: none   Do NOT wear jewelry (body piercing), metal hair clips/bobby pins, or nail polish. Do NOT wear lotions, powders, or perfumes.  You may wear deoderant. Do NOT shave for 48 hours prior to surgery. Do NOT bring valuables to the hospital. Leave suitcase in car.  After surgery it may be brought to your room.  For patients admitted to the hospital, checkout time is 11:00 AM the day of discharge.

## 2015-01-17 LAB — RPR: RPR: NONREACTIVE

## 2015-01-17 MED ORDER — CEFAZOLIN SODIUM 10 G IJ SOLR: 3.0000 g | INTRAMUSCULAR | Status: DC

## 2015-01-17 NOTE — Anesthesia Preprocedure Evaluation (Addendum)
Anesthesia Evaluation  Patient identified by MRN, date of birth, ID band Patient awake    Reviewed: Allergy & Precautions, H&P , NPO status , Patient's Chart, lab work & pertinent test results  History of Anesthesia Complications (+) PONV and history of anesthetic complications  Airway Mallampati: I  TM Distance: >3 FB Neck ROM: full    Dental  (+) Teeth Intact   Pulmonary neg pulmonary ROS,    breath sounds clear to auscultation       Cardiovascular negative cardio ROS   Rhythm:regular Rate:Normal     Neuro/Psych  Headaches, Depression negative psych ROS   GI/Hepatic negative GI ROS, Neg liver ROS,   Endo/Other  diabetes, Well Controlled, GestationalHyperthyroidism obesity  Renal/GU negative Renal ROS  negative genitourinary   Musculoskeletal   Abdominal (+) + obese,   Peds  Hematology negative hematology ROS (+)   Anesthesia Other Findings C/S x2  Reproductive/Obstetrics (+) Pregnancy                            Anesthesia Physical  Anesthesia Plan  ASA: II  Anesthesia Plan: Spinal   Post-op Pain Management:    Induction:   Airway Management Planned:   Additional Equipment:   Intra-op Plan:   Post-operative Plan:   Informed Consent: I have reviewed the patients History and Physical, chart, labs and discussed the procedure including the risks, benefits and alternatives for the proposed anesthesia with the patient or authorized representative who has indicated his/her understanding and acceptance.     Plan Discussed with: Surgeon and CRNA  Anesthesia Plan Comments:         Anesthesia Quick Evaluation

## 2015-01-18 ENCOUNTER — Inpatient Hospital Stay (HOSPITAL_COMMUNITY): Payer: Managed Care, Other (non HMO) | Admitting: Anesthesiology

## 2015-01-18 ENCOUNTER — Inpatient Hospital Stay (HOSPITAL_COMMUNITY)
Admission: RE | Admit: 2015-01-18 | Discharge: 2015-01-21 | DRG: 766 | Disposition: A | Discharge status: Home / Self Care | Payer: Managed Care, Other (non HMO) | Source: Ambulatory Visit | Attending: Obstetrics and Gynecology | Admitting: Obstetrics and Gynecology

## 2015-01-18 ENCOUNTER — Encounter (HOSPITAL_COMMUNITY): Payer: Self-pay

## 2015-01-18 ENCOUNTER — Encounter (HOSPITAL_COMMUNITY)
Admission: RE | Disposition: A | Discharge status: Home / Self Care | Payer: Self-pay | Source: Ambulatory Visit | Attending: Obstetrics and Gynecology

## 2015-01-18 DIAGNOSIS — Z3A39 39 weeks gestation of pregnancy: Secondary | ICD-10-CM

## 2015-01-18 DIAGNOSIS — E669 Obesity, unspecified: Secondary | ICD-10-CM | POA: Diagnosis present

## 2015-01-18 DIAGNOSIS — Z8349 Family history of other endocrine, nutritional and metabolic diseases: Secondary | ICD-10-CM

## 2015-01-18 DIAGNOSIS — Z8249 Family history of ischemic heart disease and other diseases of the circulatory system: Secondary | ICD-10-CM

## 2015-01-18 DIAGNOSIS — Z6835 Body mass index (BMI) 35.0-35.9, adult: Secondary | ICD-10-CM

## 2015-01-18 DIAGNOSIS — O99344 Other mental disorders complicating childbirth: Secondary | ICD-10-CM | POA: Diagnosis present

## 2015-01-18 DIAGNOSIS — Z302 Encounter for sterilization: Secondary | ICD-10-CM | POA: Diagnosis not present

## 2015-01-18 DIAGNOSIS — O99214 Obesity complicating childbirth: Secondary | ICD-10-CM | POA: Diagnosis present

## 2015-01-18 DIAGNOSIS — E0590 Thyrotoxicosis, unspecified without thyrotoxic crisis or storm: Secondary | ICD-10-CM | POA: Diagnosis present

## 2015-01-18 DIAGNOSIS — Z8042 Family history of malignant neoplasm of prostate: Secondary | ICD-10-CM

## 2015-01-18 DIAGNOSIS — Z8489 Family history of other specified conditions: Secondary | ICD-10-CM | POA: Diagnosis not present

## 2015-01-18 DIAGNOSIS — F329 Major depressive disorder, single episode, unspecified: Secondary | ICD-10-CM | POA: Diagnosis present

## 2015-01-18 DIAGNOSIS — O99284 Endocrine, nutritional and metabolic diseases complicating childbirth: Secondary | ICD-10-CM | POA: Diagnosis present

## 2015-01-18 DIAGNOSIS — O280 Abnormal hematological finding on antenatal screening of mother: Secondary | ICD-10-CM

## 2015-01-18 DIAGNOSIS — Z98891 History of uterine scar from previous surgery: Secondary | ICD-10-CM

## 2015-01-18 DIAGNOSIS — O34211 Maternal care for low transverse scar from previous cesarean delivery: Principal | ICD-10-CM | POA: Diagnosis present

## 2015-01-18 DIAGNOSIS — R51 Headache: Secondary | ICD-10-CM | POA: Diagnosis present

## 2015-01-18 DIAGNOSIS — O3660X0 Maternal care for excessive fetal growth, unspecified trimester, not applicable or unspecified: Secondary | ICD-10-CM | POA: Diagnosis present

## 2015-01-18 DIAGNOSIS — O09523 Supervision of elderly multigravida, third trimester: Secondary | ICD-10-CM

## 2015-01-18 HISTORY — DX: Nonrheumatic mitral (valve) prolapse: I34.1

## 2015-01-18 HISTORY — PX: TUBAL LIGATION: SHX77

## 2015-01-18 SURGERY — LIGATION, FALLOPIAN TUBE, BILATERAL
Anesthesia: Spinal | Site: Abdomen

## 2015-01-18 MED ORDER — SCOPOLAMINE 1 MG/3DAYS TD PT72: 1.0000 | MEDICATED_PATCH | Freq: Once | TRANSDERMAL | Status: DC

## 2015-01-18 MED ORDER — MENTHOL 3 MG MT LOZG: 1.0000 | LOZENGE | OROMUCOSAL | Status: DC | PRN

## 2015-01-18 MED ORDER — DIPHENHYDRAMINE HCL 25 MG PO CAPS: 25.0000 mg | ORAL_CAPSULE | Freq: Four times a day (QID) | ORAL | Status: DC | PRN

## 2015-01-18 MED ORDER — NALBUPHINE HCL 10 MG/ML IJ SOLN: 5.0000 mg | Freq: Once | INTRAMUSCULAR | Status: DC | PRN

## 2015-01-18 MED ORDER — FENTANYL CITRATE (PF) 100 MCG/2ML IJ SOLN
INTRAMUSCULAR | Status: DC | PRN
  Administered 2015-01-18: 10 ug via INTRATHECAL

## 2015-01-18 MED ORDER — IBUPROFEN 600 MG PO TABS
600.0000 mg | ORAL_TABLET | Freq: Four times a day (QID) | ORAL | Status: DC
  Administered 2015-01-18 – 2015-01-21 (×10): 600 mg via ORAL
  Filled 2015-01-18 (×10): qty 1

## 2015-01-18 MED ORDER — ZOLPIDEM TARTRATE 5 MG PO TABS: 5.0000 mg | ORAL_TABLET | Freq: Every evening | ORAL | Status: DC | PRN

## 2015-01-18 MED ORDER — OXYTOCIN 10 UNIT/ML IJ SOLN
INTRAMUSCULAR | Status: AC
  Filled 2015-01-18: qty 4

## 2015-01-18 MED ORDER — SENNOSIDES-DOCUSATE SODIUM 8.6-50 MG PO TABS
2.0000 | ORAL_TABLET | ORAL | Status: DC
  Administered 2015-01-18 – 2015-01-21 (×3): 2 via ORAL
  Filled 2015-01-18 (×3): qty 2

## 2015-01-18 MED ORDER — OXYCODONE-ACETAMINOPHEN 5-325 MG PO TABS: 2.0000 | ORAL_TABLET | ORAL | Status: DC | PRN

## 2015-01-18 MED ORDER — FENTANYL CITRATE (PF) 100 MCG/2ML IJ SOLN
INTRAMUSCULAR | Status: AC
  Filled 2015-01-18: qty 2

## 2015-01-18 MED ORDER — BUPIVACAINE IN DEXTROSE 0.75-8.25 % IT SOLN
INTRATHECAL | Status: DC | PRN
  Administered 2015-01-18: 1.7 mL via INTRATHECAL

## 2015-01-18 MED ORDER — ACETAMINOPHEN 500 MG PO TABS
1000.0000 mg | ORAL_TABLET | Freq: Four times a day (QID) | ORAL | Status: AC
  Administered 2015-01-18 – 2015-01-19 (×3): 1000 mg via ORAL
  Filled 2015-01-18 (×3): qty 2

## 2015-01-18 MED ORDER — SODIUM CHLORIDE 0.9 % IR SOLN
Status: DC | PRN
  Administered 2015-01-18: 1000 mL

## 2015-01-18 MED ORDER — MORPHINE SULFATE (PF) 0.5 MG/ML IJ SOLN
INTRAMUSCULAR | Status: DC | PRN
  Administered 2015-01-18: .2 mg via INTRATHECAL

## 2015-01-18 MED ORDER — EPHEDRINE SULFATE 50 MG/ML IJ SOLN
INTRAMUSCULAR | Status: DC | PRN
  Administered 2015-01-18: 10 mg via INTRAVENOUS

## 2015-01-18 MED ORDER — KETOROLAC TROMETHAMINE 30 MG/ML IJ SOLN
30.0000 mg | Freq: Four times a day (QID) | INTRAMUSCULAR | Status: DC | PRN
  Administered 2015-01-18: 30 mg via INTRAMUSCULAR

## 2015-01-18 MED ORDER — PROMETHAZINE HCL 25 MG/ML IJ SOLN: 6.2500 mg | INTRAMUSCULAR | Status: DC | PRN

## 2015-01-18 MED ORDER — PRENATAL MULTIVITAMIN CH
1.0000 | ORAL_TABLET | Freq: Every day | ORAL | Status: DC
  Administered 2015-01-19 – 2015-01-20 (×2): 1 via ORAL
  Filled 2015-01-18 (×3): qty 1

## 2015-01-18 MED ORDER — DIPHENHYDRAMINE HCL 50 MG/ML IJ SOLN: 12.5000 mg | INTRAMUSCULAR | Status: DC | PRN

## 2015-01-18 MED ORDER — KETOROLAC TROMETHAMINE 30 MG/ML IJ SOLN
INTRAMUSCULAR | Status: AC
  Filled 2015-01-18: qty 1

## 2015-01-18 MED ORDER — PHENYLEPHRINE 8 MG IN D5W 100 ML (0.08MG/ML) PREMIX OPTIME
INJECTION | INTRAVENOUS | Status: AC
Start: 2015-01-18 — End: 2015-01-18
  Filled 2015-01-18: qty 100

## 2015-01-18 MED ORDER — MEDROXYPROGESTERONE ACETATE 150 MG/ML IM SUSP: 150.0000 mg | INTRAMUSCULAR | Status: DC | PRN

## 2015-01-18 MED ORDER — SCOPOLAMINE 1 MG/3DAYS TD PT72
MEDICATED_PATCH | TRANSDERMAL | Status: AC
  Administered 2015-01-18: 1.5 mg via TRANSDERMAL
  Filled 2015-01-18: qty 1

## 2015-01-18 MED ORDER — ONDANSETRON HCL 4 MG/2ML IJ SOLN
INTRAMUSCULAR | Status: AC
  Filled 2015-01-18: qty 2

## 2015-01-18 MED ORDER — EPHEDRINE 5 MG/ML INJ
INTRAVENOUS | Status: AC
Start: 2015-01-18 — End: 2015-01-18
  Filled 2015-01-18: qty 10

## 2015-01-18 MED ORDER — SODIUM CHLORIDE 0.9 % IJ SOLN: 3.0000 mL | INTRAMUSCULAR | Status: DC | PRN

## 2015-01-18 MED ORDER — NALOXONE HCL 0.4 MG/ML IJ SOLN: 0.4000 mg | INTRAMUSCULAR | Status: DC | PRN

## 2015-01-18 MED ORDER — KETOROLAC TROMETHAMINE 30 MG/ML IJ SOLN: 30.0000 mg | Freq: Four times a day (QID) | INTRAMUSCULAR | Status: DC | PRN

## 2015-01-18 MED ORDER — LACTATED RINGERS IV SOLN
Freq: Once | INTRAVENOUS | Status: AC
  Administered 2015-01-18: 12:00:00 via INTRAVENOUS

## 2015-01-18 MED ORDER — OXYTOCIN 40 UNITS IN LACTATED RINGERS INFUSION - SIMPLE MED: 62.5000 mL/h | INTRAVENOUS | Status: AC

## 2015-01-18 MED ORDER — TETANUS-DIPHTH-ACELL PERTUSSIS 5-2.5-18.5 LF-MCG/0.5 IM SUSP: 0.5000 mL | Freq: Once | INTRAMUSCULAR | Status: DC

## 2015-01-18 MED ORDER — OXYCODONE-ACETAMINOPHEN 5-325 MG PO TABS
1.0000 | ORAL_TABLET | ORAL | Status: DC | PRN
Start: 2015-01-18 — End: 2015-01-21

## 2015-01-18 MED ORDER — METOCLOPRAMIDE HCL 5 MG/ML IJ SOLN
INTRAMUSCULAR | Status: AC
Start: 2015-01-18 — End: 2015-01-18
  Filled 2015-01-18: qty 2

## 2015-01-18 MED ORDER — PHENYLEPHRINE 8 MG IN D5W 100 ML (0.08MG/ML) PREMIX OPTIME
INJECTION | INTRAVENOUS | Status: DC | PRN
  Administered 2015-01-18: 60 ug/min via INTRAVENOUS

## 2015-01-18 MED ORDER — MEPERIDINE HCL 25 MG/ML IJ SOLN: 6.2500 mg | INTRAMUSCULAR | Status: DC | PRN

## 2015-01-18 MED ORDER — OXYTOCIN 10 UNIT/ML IJ SOLN
40.0000 [IU] | INTRAVENOUS | Status: DC | PRN
  Administered 2015-01-18: 40 [IU] via INTRAVENOUS

## 2015-01-18 MED ORDER — NALBUPHINE HCL 10 MG/ML IJ SOLN: 5.0000 mg | INTRAMUSCULAR | Status: DC | PRN

## 2015-01-18 MED ORDER — SIMETHICONE 80 MG PO CHEW: 80.0000 mg | CHEWABLE_TABLET | ORAL | Status: DC | PRN

## 2015-01-18 MED ORDER — MORPHINE SULFATE (PF) 0.5 MG/ML IJ SOLN
INTRAMUSCULAR | Status: AC
Start: 2015-01-18 — End: 2015-01-18
  Filled 2015-01-18: qty 10

## 2015-01-18 MED ORDER — DIBUCAINE 1 % RE OINT: 1.0000 "application " | TOPICAL_OINTMENT | RECTAL | Status: DC | PRN

## 2015-01-18 MED ORDER — FENTANYL CITRATE (PF) 100 MCG/2ML IJ SOLN: 25.0000 ug | INTRAMUSCULAR | Status: DC | PRN

## 2015-01-18 MED ORDER — CEFAZOLIN SODIUM-DEXTROSE 2-3 GM-% IV SOLR
2.0000 g | Freq: Once | INTRAVENOUS | Status: AC
  Administered 2015-01-18: 2 g via INTRAVENOUS

## 2015-01-18 MED ORDER — LACTATED RINGERS IV SOLN
INTRAVENOUS | Status: DC | PRN
Start: 2015-01-18 — End: 2015-01-18
  Administered 2015-01-18: 13:00:00 via INTRAVENOUS

## 2015-01-18 MED ORDER — CEFAZOLIN SODIUM-DEXTROSE 2-3 GM-% IV SOLR
INTRAVENOUS | Status: AC
Start: 2015-01-18 — End: 2015-01-18
  Filled 2015-01-18: qty 50

## 2015-01-18 MED ORDER — ACETAMINOPHEN 325 MG PO TABS: 650.0000 mg | ORAL_TABLET | ORAL | Status: DC | PRN

## 2015-01-18 MED ORDER — SIMETHICONE 80 MG PO CHEW
80.0000 mg | CHEWABLE_TABLET | ORAL | Status: DC
  Administered 2015-01-18 – 2015-01-21 (×3): 80 mg via ORAL
  Filled 2015-01-18 (×3): qty 1

## 2015-01-18 MED ORDER — DIPHENHYDRAMINE HCL 25 MG PO CAPS: 25.0000 mg | ORAL_CAPSULE | ORAL | Status: DC | PRN

## 2015-01-18 MED ORDER — LACTATED RINGERS IV SOLN
INTRAVENOUS | Status: DC | PRN
  Administered 2015-01-18 (×3): via INTRAVENOUS

## 2015-01-18 MED ORDER — PHENYLEPHRINE 40 MCG/ML (10ML) SYRINGE FOR IV PUSH (FOR BLOOD PRESSURE SUPPORT)
PREFILLED_SYRINGE | INTRAVENOUS | Status: AC
Start: 2015-01-18 — End: 2015-01-18
  Filled 2015-01-18: qty 10

## 2015-01-18 MED ORDER — SCOPOLAMINE 1 MG/3DAYS TD PT72
1.0000 | MEDICATED_PATCH | Freq: Once | TRANSDERMAL | Status: DC
  Administered 2015-01-18: 1.5 mg via TRANSDERMAL

## 2015-01-18 MED ORDER — MEASLES, MUMPS & RUBELLA VAC ~~LOC~~ INJ: 0.5000 mL | INJECTION | Freq: Once | SUBCUTANEOUS | Status: DC

## 2015-01-18 MED ORDER — METOCLOPRAMIDE HCL 5 MG/ML IJ SOLN
INTRAMUSCULAR | Status: DC | PRN
  Administered 2015-01-18 (×2): 5 mg via INTRAVENOUS

## 2015-01-18 MED ORDER — SIMETHICONE 80 MG PO CHEW
80.0000 mg | CHEWABLE_TABLET | Freq: Three times a day (TID) | ORAL | Status: DC
  Administered 2015-01-18 – 2015-01-20 (×7): 80 mg via ORAL
  Filled 2015-01-18 (×7): qty 1

## 2015-01-18 MED ORDER — PHENYLEPHRINE HCL 10 MG/ML IJ SOLN
INTRAMUSCULAR | Status: DC | PRN
  Administered 2015-01-18 (×2): 40 ug via INTRAVENOUS

## 2015-01-18 MED ORDER — DEXTROSE IN LACTATED RINGERS 5 % IV SOLN
INTRAVENOUS | Status: DC
  Administered 2015-01-18: via INTRAVENOUS

## 2015-01-18 MED ORDER — WITCH HAZEL-GLYCERIN EX PADS: 1.0000 "application " | MEDICATED_PAD | CUTANEOUS | Status: DC | PRN

## 2015-01-18 MED ORDER — LANOLIN HYDROUS EX OINT: 1.0000 "application " | TOPICAL_OINTMENT | CUTANEOUS | Status: DC | PRN

## 2015-01-18 MED ORDER — ONDANSETRON HCL 4 MG/2ML IJ SOLN: 4.0000 mg | Freq: Three times a day (TID) | INTRAMUSCULAR | Status: DC | PRN

## 2015-01-18 MED ORDER — NALOXONE HCL 2 MG/2ML IJ SOSY: 1.0000 ug/kg/h | PREFILLED_SYRINGE | INTRAVENOUS | Status: DC | PRN

## 2015-01-18 SURGICAL SUPPLY — 30 items
CLAMP CORD UMBIL (MISCELLANEOUS) IMPLANT
CLOTH BEACON ORANGE TIMEOUT ST (SAFETY) ×5 IMPLANT
DRAPE SHEET LG 3/4 BI-LAMINATE (DRAPES) IMPLANT
DRSG OPSITE POSTOP 4X10 (GAUZE/BANDAGES/DRESSINGS) ×5 IMPLANT
DURAPREP 26ML APPLICATOR (WOUND CARE) ×5 IMPLANT
ELECT REM PT RETURN 9FT ADLT (ELECTROSURGICAL) ×5
ELECTRODE REM PT RTRN 9FT ADLT (ELECTROSURGICAL) ×3 IMPLANT
EXTRACTOR VACUUM M CUP 4 TUBE (SUCTIONS) ×4 IMPLANT
EXTRACTOR VACUUM M CUP 4' TUBE (SUCTIONS) ×1
GLOVE BIO SURGEON STRL SZ 6.5 (GLOVE) ×4 IMPLANT
GLOVE BIO SURGEONS STRL SZ 6.5 (GLOVE) ×1
GLOVE BIOGEL PI IND STRL 7.0 (GLOVE) ×6 IMPLANT
GLOVE BIOGEL PI INDICATOR 7.0 (GLOVE) ×4
GOWN STRL REUS W/TWL LRG LVL3 (GOWN DISPOSABLE) ×10 IMPLANT
KIT ABG SYR 3ML LUER SLIP (SYRINGE) IMPLANT
LIQUID BAND (GAUZE/BANDAGES/DRESSINGS) ×5 IMPLANT
NEEDLE HYPO 25X5/8 SAFETYGLIDE (NEEDLE) IMPLANT
NS IRRIG 1000ML POUR BTL (IV SOLUTION) ×5 IMPLANT
PACK C SECTION WH (CUSTOM PROCEDURE TRAY) ×5 IMPLANT
PAD OB MATERNITY 4.3X12.25 (PERSONAL CARE ITEMS) ×5 IMPLANT
PENCIL SMOKE EVAC W/HOLSTER (ELECTROSURGICAL) ×5 IMPLANT
SUT CHROMIC 0 CT 802H (SUTURE) IMPLANT
SUT CHROMIC 0 CTX 36 (SUTURE) ×15 IMPLANT
SUT MON AB-0 CT1 36 (SUTURE) ×5 IMPLANT
SUT PDS AB 0 CTX 60 (SUTURE) ×5 IMPLANT
SUT PLAIN 0 NONE (SUTURE) IMPLANT
SUT PLAIN 2 0 XLH (SUTURE) ×5 IMPLANT
SUT VIC AB 4-0 KS 27 (SUTURE) IMPLANT
TOWEL OR 17X24 6PK STRL BLUE (TOWEL DISPOSABLE) ×5 IMPLANT
TRAY FOLEY CATH SILVER 14FR (SET/KITS/TRAYS/PACK) IMPLANT

## 2015-01-18 NOTE — Op Note (Signed)
[name]:  [name], Peggy [name]              ACCOUNT NO.:  [id number]  MEDICAL RECORD NO.:  [id number]  LOCATION:  9147                          FACILITY:  [facility]  PHYSICIAN:  Zelphia CairoGretchen Linkyn Gobin, MD    DATE OF BIRTH:  Aug 20, 1975  DATE OF PROCEDURE:  01/18/2015 DATE OF DISCHARGE:                              OPERATIVE REPORT   PREOPERATIVE DIAGNOSES: 1. Previous C-section. 2. Desires sterilization. 3. Large for gestational age infant.  PROCEDURE: 1. A repeat cesarean section. 2. Bilateral partial salpingectomy.  SURGEON:  Zelphia CairoGretchen Trecia Maring, MD.  PROCEDURE IN DETAIL:  The patient was taken to the operating room, where spinal anesthesia was found to be adequate.  She was placed in the supine position with a left tilt, prepped and draped in sterile fashion and a sterile Foley catheter was inserted.  Time-out was performed.  A Pfannenstiel skin incision was made with a scalpel and extended to the level of the fascia.  The fascia was incised in the midline and extended laterally using curved Mayo scissors.  Kocher clamps were used to grasp the superior and inferior portion of the fascia.  The fascia was tented upwards and the underlying rectus muscles were dissected off using curved Mayo's and Bovie.  Peritoneum was identified and entered sharply. This was extended superiorly and inferiorly.  Bladder blade was inserted.  Vesicouterine peritoneum was dissected off the lower uterine segment.  Uterine incision was made and extended bluntly.  The fetal vertex was brought to the uterine incision while I also applied fundal pressure, the vertex could not be delivered through the incision. Vacuum was placed and then the infant's head was delivered with gentle traction and fundal pressure.  Suction was removed.  The shoulders and body were then delivered.  Cord was clamped and cut and the infant was taken to the awaiting pediatric staff.  The placenta was manually removed from the uterus.  The uterus was  cleared of all clots and debris using clean lap sponge.  Uterine incision was reapproximated using a running locked chromic.  Our attention was turned to the fallopian tubes.  The left fallopian tube was grasped with a Babcock clamp and an incision was made with the Bovie in the mesosalpinx.  A knuckle of tube was doubly tied using plain gut suture.  The knuckle of tube was then excised using Metzenbaum scissors.  This procedure was repeated on the opposite fallopian tube. Both segments were passed off to be sent to Pathology.  Hemostasis was noted.  Uterus was placed back into the pelvic cavity.  The pelvis was irrigated and the uterine incision was reinspected and found to be hemostatic.  Peritoneum was closed with Monocryl.  The fascia was closed with PDS.  Subcutaneous tissue was reapproximated with plain gut and the skin was closed with Vicryl.  Skin glue was placed over the incision.  The patient was taken to the recovery room in stable condition.  Sponge, lap, needle, and instrument counts were correct x2.     Zelphia CairoGretchen Jezreel Sisk, MD     GA/MEDQ  D:  01/18/2015  T:  01/18/2015  Job:  [id number]

## 2015-01-18 NOTE — Anesthesia Procedure Notes (Signed)
Spinal Patient location during procedure: OR Staffing Anesthesiologist: Carlynn Leduc Performed by: anesthesiologist  Preanesthetic Checklist Completed: patient identified, site marked, surgical consent, pre-op evaluation, timeout performed, IV checked, risks and benefits discussed and monitors and equipment checked Spinal Block Patient position: sitting Prep: ChloraPrep and DuraPrep Patient monitoring: heart rate, continuous pulse ox and blood pressure Location: L4-5 Injection technique: single-shot Needle Needle type: Spinocan  Needle gauge: 24 G Needle length: 9 cm Assessment Sensory level: T4 Additional Notes Functioning IV was confirmed and monitors were applied. Sterile prep and drape, including hand hygiene, mask and sterile gloves were used. The patient was positioned and the spine was prepped. The skin was anesthetized with lidocaine.  Free flow of clear CSF was obtained prior to injecting local anesthetic into the CSF.  The spinal needle aspirated freely following injection.  The needle was carefully withdrawn.  The patient tolerated the procedure well. Consent was obtained prior to procedure with all questions answered and concerns addressed.  Blima DessertBen Seanpatrick Maisano, MD

## 2015-01-18 NOTE — Transfer of Care (Signed)
Immediate Anesthesia Transfer of Care Note  Patient: Peggy BlackbirdSarah P Mccuistion  Procedure(s) Performed: Procedure(s) with comments: CESAREAN SECTION WITH BILATERAL TUBAL LIGATION (Bilateral) - Repeat edc 01/23/15 nkda  Patient Location: PACU  Anesthesia Type:Spinal  Level of Consciousness: awake, alert , oriented and patient cooperative  Airway & Oxygen Therapy: Patient Spontanous Breathing  Post-op Assessment: Post -op Vital signs reviewed and stable  Post vital signs: Reviewed and stable  Last Vitals:  Filed Vitals:   01/18/15 1134  BP: 119/76  Pulse: 95  Temp: 36.8 C  Resp: 18    Complications: No apparent anesthesia complications

## 2015-01-18 NOTE — Anesthesia Postprocedure Evaluation (Signed)
Anesthesia Post Note  Patient: Peggy BlackbirdSarah P [name]  Procedure(s) Performed: Procedure(s) (LRB): CESAREAN SECTION WITH BILATERAL TUBAL LIGATION (Bilateral)  Patient location during evaluation: PACU Anesthesia Type: Spinal Level of consciousness: oriented and awake and alert Pain management: pain level controlled Vital Signs Assessment: post-procedure vital signs reviewed and stable Respiratory status: spontaneous breathing, respiratory function stable and patient connected to nasal cannula oxygen Cardiovascular status: blood pressure returned to baseline and stable Postop Assessment: no headache and no backache Anesthetic complications: no    Last Vitals:  Filed Vitals:   01/18/15 1404 01/18/15 1405  BP:    Pulse: 92 95  Temp:    Resp: 15 18    Last Pain: There were no vitals filed for this visit.               Reino KentJudd, Nachmen Mansel J

## 2015-01-18 NOTE — Lactation Note (Signed)
This note was copied from the chart of Peggy [name] Hieronymus. Lactation Consultation Note  Patient Name: Peggy [name] Carne WUJWJ'XToday's Date: 01/18/2015 Reason for consult: Initial assessment Baby at 4 hr of life. Mom is worried about the baby's latch and with no making enough milk. She tried bf her oldest for 2 wk but had trouble with poor latch so she pumped and had low supply. With her middle child, she had more latch issues and the baby lost "too much wt", she did not pump for the 2nd baby. Mom stated that she does not have hyperthyroid any more, it was only for 6 m following a miscarriage. She did not have GDM with this baby. She stated that the baby does not have any chromosomal abnormalities that she is aware of at this point.  Mom reports a sore spot ~2cm to the left of the areola on the left breast. When mom touches the area colostrum can be seen. She stated that it feels much better today than yesterday. Suggested that mom latch baby in football position to that breast at next feeding. Demonstrated manual expression, colostrum noted bilaterally. Discussed baby behavior, feeding frequency, pumping, baby belly size, voids, wt loss, breast changes, and nipple care. Given lactation handouts. Aware of OP services and support group.        Maternal Data Has patient been taught Hand Expression?: Yes Does the patient have breastfeeding experience prior to this delivery?: Yes  Feeding Feeding Type: Breast Fed Length of feed:  (few sucks)  LATCH Score/Interventions Latch: Repeated attempts needed to sustain latch, nipple held in mouth throughout feeding, stimulation needed to elicit sucking reflex. (tongue sucking) Intervention(s): Adjust position;Assist with latch  Audible Swallowing: None Intervention(s): Skin to skin  Type of Nipple: Everted at rest and after stimulation  Comfort (Breast/Nipple): Soft / non-tender     Hold (Positioning): Assistance needed to correctly position infant at  breast and maintain latch.  LATCH Score: 6  Lactation Tools Discussed/Used WIC Program: No   Consult Status Consult Status: Follow-up Date: 01/19/15 Follow-up type: In-patient    Rulon Eisenmengerlizabeth E Momina Hunton 01/18/2015, 5:42 PM

## 2015-01-19 ENCOUNTER — Encounter (HOSPITAL_COMMUNITY): Payer: Self-pay | Admitting: Obstetrics and Gynecology

## 2015-01-19 LAB — CBC
HEMATOCRIT: 33.6 % — AB (ref 36.0–46.0)
Hemoglobin: 11.2 g/dL — ABNORMAL LOW (ref 12.0–15.0)
MCH: 27.3 pg (ref 26.0–34.0)
MCHC: 33.3 g/dL (ref 30.0–36.0)
MCV: 82 fL (ref 78.0–100.0)
Platelets: 166 10*3/uL (ref 150–400)
RBC: 4.1 MIL/uL (ref 3.87–5.11)
RDW: 14.3 % (ref 11.5–15.5)
WBC: 10 10*3/uL (ref 4.0–10.5)

## 2015-01-19 LAB — BIRTH TISSUE RECOVERY COLLECTION (PLACENTA DONATION)

## 2015-01-19 NOTE — Progress Notes (Signed)
Subjective: Postpartum Day 1: Cesarean Delivery Patient reports tolerating PO and no problems voiding.    Objective: Vital signs in last 24 hours: Temp:  [97.4 F (36.3 C)-98.9 F (37.2 C)] 98.2 F (36.8 C) (12/09 0329) Pulse Rate:  [25-104] 60 (12/09 0329) Resp:  [10-25] 20 (12/09 0329) BP: (83-119)/(48-76) 98/56 mmHg (12/09 0329) SpO2:  [80 %-100 %] 99 % (12/09 0329)  Physical Exam:  General: alert and cooperative Lochia: appropriate Uterine Fundus: firm Incision: healing well DVT Evaluation: No evidence of DVT seen on physical exam. Negative Homan's sign. No cords or calf tenderness. Calf/Ankle edema is present.   Recent Labs  01/16/15 0940 01/19/15 0600  HGB 12.4 11.2*  HCT 36.7 33.6*    Assessment/Plan: Status post Cesarean section. Doing well postoperatively.  Continue current care.  Joanthony Hamza G 01/19/2015, 8:13 AM

## 2015-01-19 NOTE — Lactation Note (Addendum)
This note was copied from the chart of Boy Gretchen PortelaSarah Hinks. Lactation Consultation Note  Baby 22 hours old and circumcised at aprox 0935 this morning and has been sleepy. Encouraged STS.  Reviewed hand expression w/ mother and she was able to express drops of colostrum.  Mother asked about tender area in 3'oclock position on outside of  L areola that appeared 2 days ago.  Mother feels it releases some w/ hand expression.  It is continues, bring to attention of LC and OB. Mother placed baby in football hold.  He latched off and on.  Helped guide her to a deeper latch.  Showed her how to hold breast further back to support breast. Baby very sleepy.  Only able to keep him awake for brief 5 min latch. Mom encouraged to feed baby 8-12 times/24 hours and with feeding cues.  Discussed cluster to feeding. Mother's left nipple is starting to get sore.  Provided mother w/ comfort gels and suggest she apply ebm. Noted mid posterior lingual frenulum.  Patient Name: Boy Gretchen PortelaSarah Ridolfi WUJWJ'XToday's Date: 01/19/2015 Reason for consult: Follow-up assessment   Maternal Data    Feeding Feeding Type: Breast Fed Length of feed: 5 min (baby recently circ sleepy)  LATCH Score/Interventions Latch: Repeated attempts needed to sustain latch, nipple held in mouth throughout feeding, stimulation needed to elicit sucking reflex. Intervention(s): Adjust position;Assist with latch;Breast massage  Audible Swallowing: None Intervention(s): Skin to skin;Hand expression  Type of Nipple: Everted at rest and after stimulation  Comfort (Breast/Nipple): Filling, red/small blisters or bruises, mild/mod discomfort  Problem noted: Mild/Moderate discomfort Interventions (Mild/moderate discomfort): Hand expression;Comfort gels  Hold (Positioning): No assistance needed to correctly position infant at breast.  LATCH Score: 6  Lactation Tools Discussed/Used     Consult Status Consult Status: Follow-up Date:  01/20/15 Follow-up type: In-patient    Dahlia ByesBerkelhammer, Ruth [facility] LLC[facility]os[facility] 01/19/2015, 11:39 AM

## 2015-01-19 NOTE — Progress Notes (Signed)
MOB was referred for history of depression/anxiety.  Referral is screened out by Clinical Social Worker because none of the following criteria appear to apply: -History of anxiety/depression during this pregnancy, or of post-partum depression. - Diagnosis of anxiety and/or depression within last 3 years.  Per chart review, diagnosis prior to 2013.  No concerns noted during the pregnancy.  or -MOB's symptoms are currently being treated with medication and/or therapy.  Please contact the Clinical Social Worker if needs arise or upon MOB request.   Loleta BooksSarah Noreene Boreman MSW, LCSW  [phone]

## 2015-01-20 NOTE — Progress Notes (Signed)
Subjective: Postpartum Day 2: Cesarean Delivery Patient reports incisional pain, tolerating PO and no problems voiding.    Objective: Vital signs in last 24 hours: Temp:  [98 F (36.7 C)-98.2 F (36.8 C)] 98 F (36.7 C) (12/10 [id number]) Pulse Rate:  [70-76] 76 (12/10 0638) Resp:  [18-20] 20 (12/10 [id number]) BP: (114-126)/(62-76) 126/76 mmHg (12/10 [id number])  Physical Exam:  General: alert and cooperative Lochia: appropriate Uterine Fundus: firm Incision: no significant drainage DVT Evaluation: No evidence of DVT seen on physical exam.   Recent Labs  01/19/15 0600  HGB 11.2*  HCT 33.6*    Assessment/Plan: Status post Cesarean section. Doing well postoperatively.  Continue current care.  Wheeler Incorvaia 01/20/2015, 7:43 AM

## 2015-01-20 NOTE — Lactation Note (Signed)
This note was copied from the chart of Peggy Gretchen PortelaSarah Fendrick. Lactation Consultation Note  Patient Name: Peggy Gretchen PortelaSarah Modesto ZOXWR'UToday's Date: 01/20/2015 Reason for consult: Follow-up assessment;Breast/nipple pain   Follow up with mom of 52 hour old infant. Infant with 8 BF for 15-30 minutes, 2 attempts, 4 voids and 4 stools in last 24 hours. Infant now 9 lb 10.7 oz with weight loss of 6.8% since birth. Latch scores 7 by bedside RN. Ifant currently asleep laying next to mom. Mom c/o sore nipples. Positional stripe noted to center of left nipple, faint bruising noted to right nipple. Mom is wearing comfort gels between feedings. Gave her Breast shells with instructions for use and cleaning for sore nipples. Manual pump given to mom with instructions for use. Mom is concerned and tearful when discussing her 2 older children and difficulties with nursing. Encouragement given. Enc mom to call for assistance with next feeding. Enc mom to feed 8-12 x in 24 hours at first feeding cues and to make sure infant gets a deep latch with feeding. Mom with lumpy pea sized area to 3-4 o'clock on left areola proximal to nipple that has been there for for a few days. Advised her to show to OB when he comes to visit or at first OP OB visit. Mom voiced understanding to all teaching. Will need f/u prior to d/c.    Maternal Data Formula Feeding for Exclusion: No  Feeding Feeding Type: Breast Fed Length of feed: 20 min  LATCH Score/Interventions                      Lactation Tools Discussed/Used WIC Program: No Pump Review: Setup, frequency, and cleaning   Consult Status Consult Status: Follow-up Date: 01/21/15 Follow-up type: In-patient    Silas FloodSharon S Marquavius Scaife 01/20/2015, 6:08 PM

## 2015-01-21 MED ORDER — IBUPROFEN 600 MG PO TABS: 600.0000 mg | ORAL_TABLET | Freq: Four times a day (QID) | ORAL | Status: AC

## 2015-01-21 MED ORDER — OXYCODONE-ACETAMINOPHEN 5-325 MG PO TABS: 1.0000 | ORAL_TABLET | ORAL | Status: AC | PRN

## 2015-01-21 NOTE — Discharge Summary (Signed)
Obstetric Discharge Summary Reason for Admission: cesarean section Prenatal Procedures: ultrasound Intrapartum Procedures: cesarean: low cervical, transverse, BPS Postpartum Procedures: none Complications-Operative and Postpartum: none HEMOGLOBIN  Date Value Ref Range Status  01/19/2015 11.2* 12.0 - 15.0 g/dL Final   HCT  Date Value Ref Range Status  01/19/2015 33.6* 36.0 - 46.0 % Final    Physical Exam:  General: alert and cooperative Lochia: appropriate Uterine Fundus: firm Incision: healing well, no significant drainage DVT Evaluation: No evidence of DVT seen on physical exam.  Discharge Diagnoses: Term Pregnancy-delivered  Discharge Information: Date: 01/21/2015 Activity: pelvic rest Diet: routine Medications: PNV, Ibuprofen and Percocet Condition: stable Instructions: refer to practice specific booklet Discharge to: home Follow-up Information    Schedule an appointment as soon as possible for a visit in 2 weeks to follow up.      Newborn Data: Live born female  Birth Weight: 10 lb 6 oz (4706 g) APGAR: 8, 9  Home with mother.  Peggy [name] 01/21/2015, 9:24 AM

## 2015-01-21 NOTE — Lactation Note (Signed)
This note was copied from the chart of Peggy [name]. Lactation ConsultGretchen Portelaation Note  Patient Name: Peggy Gretchen PortelaSarah Carnell ZOXWR'UToday's Date: 01/21/2015 Reason for consult: Follow-up assessment;Infant weight loss;Breast/nipple pain   Follow up with mom of 69 hour old infant. Infant with 9 Bf for 10-20 minutes, formula fed via bottle 20 cc x 3, 2 voids, and 4 stools in last 24 hours. Baby weighs 9 lb 4.9 oz today with weight loss of 10 %. Mom began supplementing with formula during night. Reviewed appropriate supplementation amounts with mom.  Mom with positional stripe to left nipple that is healing and a bruise to right nipple. Mom report there is pain with initial latch that improves with feeding. Infant cueing to feed, mom latched infant to left breast independently infant with flanged lips, rhythmic sucking and intermittent swallows noted. Mom reports breasts are filling today, colostrum easily expressed. Mom massaging/compressing with feeding. Engorgement prevention reviewed, mom has a hand pump, she declined DEBP at this time. Mom to call Ped. in am for f/u appt. Reviewed all BF information in Taking Care of Baby and Me Booklet. Reviewed LC Brochure, mom to call with questions/concerns.    Maternal Data Formula Feeding for Exclusion: No Has patient been taught Hand Expression?: Yes Does the patient have breastfeeding experience prior to this delivery?: Yes  Feeding Feeding Type: Breast Fed Length of feed: 15 min  LATCH Score/Interventions Latch: Grasps breast easily, tongue down, lips flanged, rhythmical sucking. Intervention(s): Teach feeding cues;Waking techniques Intervention(s): Breast massage;Breast compression  Audible Swallowing: Spontaneous and intermittent Intervention(s): Skin to skin;Hand expression  Type of Nipple: Everted at rest and after stimulation  Comfort (Breast/Nipple): Filling, red/small blisters or bruises, mild/mod discomfort  Problem noted: Cracked, bleeding,  blisters, bruises Interventions  (Cracked/bleeding/bruising/blister): Expressed breast milk to nipple Interventions (Mild/moderate discomfort): Hand massage;Hand expression;Pre-pump if needed;Comfort gels  Hold (Positioning): Assistance needed to correctly position infant at breast and maintain latch. Intervention(s): Support Pillows;Position options;Skin to skin;Breastfeeding basics reviewed  LATCH Score: 8  Lactation Tools Discussed/Used WIC Program: No Pump Review: Milk Storage;Setup, frequency, and cleaning   Consult Status Consult Status: Complete Follow-up type: Call as needed    Ed BlalockSharon S Artice Holohan 01/21/2015, 10:51 AM

## 2015-01-24 NOTE — Addendum Note (Signed)
Addendum  created 01/24/15 1823 by Karlyne GreenspanBenjamin Itzelle Gains, MD   Modules edited: Anesthesia Responsible Staff

## 2016-03-31 ENCOUNTER — Other Ambulatory Visit: Payer: Self-pay | Admitting: Endocrinology

## 2016-03-31 ENCOUNTER — Other Ambulatory Visit (INDEPENDENT_AMBULATORY_CARE_PROVIDER_SITE_OTHER): Payer: 59

## 2016-03-31 DIAGNOSIS — E039 Hypothyroidism, unspecified: Secondary | ICD-10-CM

## 2016-03-31 LAB — T3, FREE: T3 FREE: 3.6 pg/mL (ref 2.3–4.2)

## 2016-03-31 LAB — T4, FREE: Free T4: 0.83 ng/dL (ref 0.60–1.60)

## 2016-03-31 LAB — TSH: TSH: 3.09 u[IU]/mL (ref 0.35–4.50)

## 2016-04-04 ENCOUNTER — Ambulatory Visit (INDEPENDENT_AMBULATORY_CARE_PROVIDER_SITE_OTHER): Payer: 59 | Admitting: Endocrinology

## 2016-04-04 ENCOUNTER — Encounter: Payer: Self-pay | Admitting: Endocrinology

## 2016-04-04 VITALS — BP 118/70 | HR 86 | Ht 65.0 in | Wt 163.0 lb

## 2016-04-04 DIAGNOSIS — R5383 Other fatigue: Secondary | ICD-10-CM | POA: Diagnosis not present

## 2016-04-04 NOTE — Progress Notes (Signed)
Patient ID: Peggy [name], female   DOB: 16-Feb-1975, 41 y.o.   MRN: [id number]                                                                                                                Reason for Appointment:  ?  Thyroid problem    History of Present Illness:   The patient has had episodic feelings of fatigue, body aches and joint pains. She had symptoms in December and again in January She does not have any associated neck pain and no significant palpitations, heat intolerance or weight changes Has had some minor continuing hair fall, she delivered a baby about 14 months ago  She thinks she is feeling back to normal over the last week or so However she was concerned that she had a recurrence of her thyroiditis  She does have  have a history of psoriasis and apparently she had more lesions on her scalp in December prior to her joint pains She has not discussed this with her dermatologist or PCP  PAST history: She was seen for subacute thyroiditis in 06/2013 and she had following typical symptoms: In the first week of May she was having a feeling of sore throat and a sensation of marbles when she was swallowing.   Her main symptoms were feeling weak, tired and sore in her muscles. She was also having symptoms of her heart racing, shakiness, excessive sweating and nervousness. Despite her appetite being somewhat more than normal she was losing weight more than she was intending  She had a high ESR of 41 and was started on prednisone 30 mg which was tapered down to 10 mg a day and stopped in late June 2015  Her neck pain and other symptoms resolved along with thyroid levels improving to normal     Lab Results  Component Value Date   FREET4 0.83 03/31/2016   FREET4 0.95 09/30/2013   FREET4 0.63 08/18/2013   Lab on 03/31/2016  Component Date Value Ref Range Status  . TSH 03/31/2016 3.09  0.35 - 4.50 uIU/mL Final  . Free T4 03/31/2016 0.83  0.60 - 1.60 ng/dL Final   Comment: Specimens from patients who are undergoing biotin therapy and /or ingesting biotin supplements may contain high levels of biotin.  The higher biotin concentration in these specimens interferes with this Free T4 assay.  Specimens that contain high levels  of biotin may cause false high results for this Free T4 assay.  Please interpret results in light of the total clinical presentation of the patient.    . T3, Free 03/31/2016 3.6  2.3 - 4.2 pg/mL Final     Allergies as of 04/04/2016   No Known Allergies     Medication List       Accurate as of 04/04/16 11:17 AM. Always use your most recent med list.          calcium carbonate 500 MG chewable tablet Commonly known as:  TUMS - dosed in mg elemental calcium Chew 2  tablets by mouth daily as needed. heartburn   ibuprofen 600 MG tablet Commonly known as:  ADVIL,MOTRIN Take 1 tablet (600 mg total) by mouth every 6 (six) hours.   loratadine 10 MG tablet Commonly known as:  CLARITIN Take 10 mg by mouth daily.   oxyCODONE-acetaminophen 5-325 MG tablet Commonly known as:  PERCOCET/ROXICET Take 1-2 tablets by mouth every 4 (four) hours as needed (for pain scale 4-7).   prenatal multivitamin Tabs tablet Take 1 tablet by mouth daily at 12 noon.   Vitamin D (Ergocalciferol) 50000 units Caps capsule Commonly known as:  DRISDOL once a week.           Past Medical History:  Diagnosis Date  . Depression    past and it was situational   . Diabetes mellitus    diet controlled  . Headache    migraines   . Heart murmur   . Hyperthyroidism    past Feb 2015   . Mitral valve prolapse   . PONV (postoperative nausea and vomiting)     Past Surgical History:  Procedure Laterality Date  . CESAREAN SECTION    . CESAREAN SECTION  11/06/2011   Procedure: CESAREAN SECTION;  Surgeon: Marylynn Pearson, MD;  Location: [facility];  Service: Obstetrics;  Laterality: N/A;  REPEAT EDC 11/11/11  . CESAREAN SECTION N/A 01/18/2015   Procedure:  CESAREAN SECTION;  Surgeon: Marylynn Pearson, MD;  Location: [facility];  Service: Obstetrics;  Laterality: N/A;  . TUBAL LIGATION Bilateral 01/18/2015   Procedure: BILATERAL TUBAL LIGATION;  Surgeon: Marylynn Pearson, MD;  Location: [facility];  Service: Obstetrics;  Laterality: Bilateral;  . WISDOM TOOTH EXTRACTION      Family History  Problem Relation Age of Onset  . Hyperlipidemia Other   . Hypertension Other   . Hyperlipidemia Mother   . Hypertension Mother   . Hyperlipidemia Father   . Prostate cancer Father     Social History:  reports that she has never smoked. She has never used smokeless tobacco. She reports that she does not drink alcohol or use drugs.  Allergies: No Known Allergies   ROS     Menses regular    Examination:   BP 118/70   Pulse 86   Ht 5' 5"  (1.651 m)   Wt 163 lb (73.9 kg)   SpO2 97%   BMI 27.12 kg/m     General Appearance:  well-looking, pleasant  Neck: The thyroid is non-palpable and no local tenderness Deep tendon reflexes  at biceps are normal  Skin normal   Assessment/Plan:  History of subacute thyroiditis, No recurrence of thyroid problems She does not have any thyroid enlargement  Her thyroid levels are still quite normal Explained to her that her symptoms are likely to be thyroid related Since she has had some correlation of her psoriasis exacerbation and joint pains or symptoms may be related to psoriatic arthritis  She will need to discuss this with her dermatologist  Follow-up as needed only  [facility] 04/04/2016, 11:17 AM

## 2017-04-10 ENCOUNTER — Other Ambulatory Visit: Payer: Self-pay | Admitting: Internal Medicine

## 2017-04-10 ENCOUNTER — Ambulatory Visit
Admission: RE | Admit: 2017-04-10 | Discharge: 2017-04-10 | Disposition: A | Discharge status: Home / Self Care | Payer: 59 | Source: Ambulatory Visit | Attending: Internal Medicine | Admitting: Internal Medicine

## 2017-04-10 DIAGNOSIS — M545 Low back pain: Secondary | ICD-10-CM

## 2018-07-28 ENCOUNTER — Other Ambulatory Visit: Payer: Self-pay | Admitting: Obstetrics and Gynecology

## 2018-07-28 DIAGNOSIS — R928 Other abnormal and inconclusive findings on diagnostic imaging of breast: Secondary | ICD-10-CM

## 2018-07-29 ENCOUNTER — Ambulatory Visit
Admission: RE | Admit: 2018-07-29 | Discharge: 2018-07-29 | Disposition: A | Discharge status: Home / Self Care | Payer: 59 | Source: Ambulatory Visit | Attending: Obstetrics and Gynecology | Admitting: Obstetrics and Gynecology

## 2018-07-29 ENCOUNTER — Other Ambulatory Visit: Payer: Self-pay

## 2018-07-29 DIAGNOSIS — R928 Other abnormal and inconclusive findings on diagnostic imaging of breast: Secondary | ICD-10-CM

## 2018-11-16 ENCOUNTER — Other Ambulatory Visit: Payer: Self-pay

## 2018-11-16 DIAGNOSIS — Z20822 Contact with and (suspected) exposure to covid-19: Secondary | ICD-10-CM

## 2018-11-18 LAB — NOVEL CORONAVIRUS, NAA: SARS-CoV-2, NAA: NOT DETECTED

## 2019-01-03 ENCOUNTER — Other Ambulatory Visit: Payer: Self-pay

## 2019-01-03 DIAGNOSIS — Z20822 Contact with and (suspected) exposure to covid-19: Secondary | ICD-10-CM

## 2019-01-05 LAB — NOVEL CORONAVIRUS, NAA: SARS-CoV-2, NAA: NOT DETECTED

## 2019-03-16 ENCOUNTER — Ambulatory Visit: Payer: 59 | Attending: Internal Medicine

## 2019-03-16 DIAGNOSIS — Z20822 Contact with and (suspected) exposure to covid-19: Secondary | ICD-10-CM

## 2019-03-17 LAB — NOVEL CORONAVIRUS, NAA: SARS-CoV-2, NAA: NOT DETECTED

## 2019-04-09 ENCOUNTER — Ambulatory Visit: Payer: 59 | Attending: Internal Medicine

## 2019-04-09 DIAGNOSIS — Z23 Encounter for immunization: Secondary | ICD-10-CM

## 2019-04-09 NOTE — Progress Notes (Signed)
   Covid-19 Vaccination Clinic  Name:  Peggy [name]    MRN: [id number] DOB: 16-Apr-1975  04/09/2019  [name] was observed post Covid-19 immunization for 15 minutes without incidence. She was provided with Vaccine Information Sheet and instruction to access the V-Safe system.   [name] was instructed to call 911 with any severe reactions post vaccine: Marland Kitchen Difficulty breathing  . Swelling of your face and throat  . A fast heartbeat  . A bad rash all over your body  . Dizziness and weakness    Immunizations Administered    Name Date Dose VIS Date Route   Pfizer COVID-19 Vaccine 04/09/2019 12:15 PM 0.3 mL 01/21/2019 Intramuscular   Manufacturer: [employer], [employer]   Lot: [id number]   NDC: [address]-1

## 2019-05-04 ENCOUNTER — Ambulatory Visit: Payer: 59 | Attending: Internal Medicine

## 2019-05-04 DIAGNOSIS — Z23 Encounter for immunization: Secondary | ICD-10-CM

## 2019-05-04 NOTE — Progress Notes (Signed)
   Covid-19 Vaccination Clinic  Name:  Peggy [name]    MRN: [id number] DOB: Nov 15, 1975  05/04/2019  [name] was observed post Covid-19 immunization for 15 minutes without incident. She was provided with Vaccine Information Sheet and instruction to access the V-Safe system.   [name] was instructed to call 911 with any severe reactions post vaccine: Marland Kitchen Difficulty breathing  . Swelling of face and throat  . A fast heartbeat  . A bad rash all over body  . Dizziness and weakness   Immunizations Administered    Name Date Dose VIS Date Route   Pfizer COVID-19 Vaccine 05/04/2019  2:12 PM 0.3 mL 01/21/2019 Intramuscular   Manufacturer: [employer], [employer]   Lot: [id number]   NDC: [address]-7

## 2020-01-23 ENCOUNTER — Ambulatory Visit
Admission: RE | Admit: 2020-01-23 | Discharge: 2020-01-23 | Disposition: A | Discharge status: Home / Self Care | Payer: 59 | Source: Ambulatory Visit | Attending: Physician Assistant | Admitting: Physician Assistant

## 2020-01-23 ENCOUNTER — Other Ambulatory Visit: Payer: Self-pay | Admitting: Physician Assistant

## 2020-01-23 DIAGNOSIS — M25541 Pain in joints of right hand: Secondary | ICD-10-CM

## 2020-01-23 DIAGNOSIS — M25542 Pain in joints of left hand: Secondary | ICD-10-CM

## 2020-06-25 DIAGNOSIS — Z1159 Encounter for screening for other viral diseases: Secondary | ICD-10-CM | POA: Diagnosis not present

## 2020-07-15 DIAGNOSIS — J0100 Acute maxillary sinusitis, unspecified: Secondary | ICD-10-CM | POA: Diagnosis not present

## 2020-07-15 DIAGNOSIS — J209 Acute bronchitis, unspecified: Secondary | ICD-10-CM | POA: Diagnosis not present

## 2020-08-27 DIAGNOSIS — M79643 Pain in unspecified hand: Secondary | ICD-10-CM | POA: Diagnosis not present

## 2020-08-27 DIAGNOSIS — Z79899 Other long term (current) drug therapy: Secondary | ICD-10-CM | POA: Diagnosis not present

## 2020-08-27 DIAGNOSIS — M549 Dorsalgia, unspecified: Secondary | ICD-10-CM | POA: Diagnosis not present

## 2020-08-27 DIAGNOSIS — L4050 Arthropathic psoriasis, unspecified: Secondary | ICD-10-CM | POA: Diagnosis not present

## 2020-11-20 DIAGNOSIS — M79643 Pain in unspecified hand: Secondary | ICD-10-CM | POA: Diagnosis not present

## 2020-11-20 DIAGNOSIS — Z79899 Other long term (current) drug therapy: Secondary | ICD-10-CM | POA: Diagnosis not present

## 2020-11-20 DIAGNOSIS — M549 Dorsalgia, unspecified: Secondary | ICD-10-CM | POA: Diagnosis not present

## 2020-11-20 DIAGNOSIS — L4050 Arthropathic psoriasis, unspecified: Secondary | ICD-10-CM | POA: Diagnosis not present

## 2020-12-09 DIAGNOSIS — J069 Acute upper respiratory infection, unspecified: Secondary | ICD-10-CM | POA: Diagnosis not present

## 2020-12-09 DIAGNOSIS — B9689 Other specified bacterial agents as the cause of diseases classified elsewhere: Secondary | ICD-10-CM | POA: Diagnosis not present

## 2021-03-11 DIAGNOSIS — L4050 Arthropathic psoriasis, unspecified: Secondary | ICD-10-CM | POA: Diagnosis not present

## 2021-03-11 DIAGNOSIS — M79641 Pain in right hand: Secondary | ICD-10-CM | POA: Diagnosis not present

## 2021-03-11 DIAGNOSIS — M79642 Pain in left hand: Secondary | ICD-10-CM | POA: Diagnosis not present

## 2021-03-11 DIAGNOSIS — M542 Cervicalgia: Secondary | ICD-10-CM | POA: Diagnosis not present

## 2021-03-11 DIAGNOSIS — Z79899 Other long term (current) drug therapy: Secondary | ICD-10-CM | POA: Diagnosis not present

## 2021-03-11 DIAGNOSIS — M549 Dorsalgia, unspecified: Secondary | ICD-10-CM | POA: Diagnosis not present

## 2021-05-28 DIAGNOSIS — M25552 Pain in left hip: Secondary | ICD-10-CM | POA: Diagnosis not present

## 2021-05-28 DIAGNOSIS — L409 Psoriasis, unspecified: Secondary | ICD-10-CM | POA: Diagnosis not present

## 2021-05-28 DIAGNOSIS — L4050 Arthropathic psoriasis, unspecified: Secondary | ICD-10-CM | POA: Diagnosis not present

## 2021-05-28 DIAGNOSIS — Z79899 Other long term (current) drug therapy: Secondary | ICD-10-CM | POA: Diagnosis not present

## 2021-06-26 DIAGNOSIS — Z124 Encounter for screening for malignant neoplasm of cervix: Secondary | ICD-10-CM | POA: Diagnosis not present

## 2021-06-26 DIAGNOSIS — Z1151 Encounter for screening for human papillomavirus (HPV): Secondary | ICD-10-CM | POA: Diagnosis not present

## 2021-06-26 DIAGNOSIS — Z6827 Body mass index (BMI) 27.0-27.9, adult: Secondary | ICD-10-CM | POA: Diagnosis not present

## 2021-06-26 DIAGNOSIS — Z01419 Encounter for gynecological examination (general) (routine) without abnormal findings: Secondary | ICD-10-CM | POA: Diagnosis not present

## 2021-06-26 DIAGNOSIS — Z1231 Encounter for screening mammogram for malignant neoplasm of breast: Secondary | ICD-10-CM | POA: Diagnosis not present

## 2021-07-01 ENCOUNTER — Other Ambulatory Visit: Payer: Self-pay | Admitting: Obstetrics and Gynecology

## 2021-07-01 DIAGNOSIS — R928 Other abnormal and inconclusive findings on diagnostic imaging of breast: Secondary | ICD-10-CM

## 2021-07-10 ENCOUNTER — Ambulatory Visit
Admission: RE | Admit: 2021-07-10 | Discharge: 2021-07-10 | Disposition: A | Discharge status: Home / Self Care | Payer: 59 | Source: Ambulatory Visit | Attending: Obstetrics and Gynecology | Admitting: Obstetrics and Gynecology

## 2021-07-10 ENCOUNTER — Ambulatory Visit
Admission: RE | Admit: 2021-07-10 | Discharge: 2021-07-10 | Disposition: A | Discharge status: Home / Self Care | Payer: BC Managed Care – PPO | Source: Ambulatory Visit | Attending: Obstetrics and Gynecology | Admitting: Obstetrics and Gynecology

## 2021-07-10 DIAGNOSIS — R922 Inconclusive mammogram: Secondary | ICD-10-CM | POA: Diagnosis not present

## 2021-07-10 DIAGNOSIS — R928 Other abnormal and inconclusive findings on diagnostic imaging of breast: Secondary | ICD-10-CM

## 2021-07-10 DIAGNOSIS — N6001 Solitary cyst of right breast: Secondary | ICD-10-CM | POA: Diagnosis not present

## 2021-08-27 DIAGNOSIS — Z79899 Other long term (current) drug therapy: Secondary | ICD-10-CM | POA: Diagnosis not present

## 2021-08-27 DIAGNOSIS — L409 Psoriasis, unspecified: Secondary | ICD-10-CM | POA: Diagnosis not present

## 2021-08-27 DIAGNOSIS — L4050 Arthropathic psoriasis, unspecified: Secondary | ICD-10-CM | POA: Diagnosis not present

## 2021-08-27 DIAGNOSIS — M25569 Pain in unspecified knee: Secondary | ICD-10-CM | POA: Diagnosis not present

## 2021-11-15 DIAGNOSIS — Z01818 Encounter for other preprocedural examination: Secondary | ICD-10-CM | POA: Diagnosis not present

## 2021-11-26 DIAGNOSIS — L4050 Arthropathic psoriasis, unspecified: Secondary | ICD-10-CM | POA: Diagnosis not present

## 2021-11-26 DIAGNOSIS — M25569 Pain in unspecified knee: Secondary | ICD-10-CM | POA: Diagnosis not present

## 2021-11-26 DIAGNOSIS — Z79899 Other long term (current) drug therapy: Secondary | ICD-10-CM | POA: Diagnosis not present

## 2021-11-26 DIAGNOSIS — L409 Psoriasis, unspecified: Secondary | ICD-10-CM | POA: Diagnosis not present

## 2022-01-10 DIAGNOSIS — L4050 Arthropathic psoriasis, unspecified: Secondary | ICD-10-CM | POA: Diagnosis not present

## 2022-01-24 DIAGNOSIS — L4050 Arthropathic psoriasis, unspecified: Secondary | ICD-10-CM | POA: Diagnosis not present

## 2022-02-25 DIAGNOSIS — L4050 Arthropathic psoriasis, unspecified: Secondary | ICD-10-CM | POA: Diagnosis not present

## 2022-03-03 DIAGNOSIS — L4050 Arthropathic psoriasis, unspecified: Secondary | ICD-10-CM | POA: Diagnosis not present

## 2022-03-03 DIAGNOSIS — M25569 Pain in unspecified knee: Secondary | ICD-10-CM | POA: Diagnosis not present

## 2022-03-03 DIAGNOSIS — Z79899 Other long term (current) drug therapy: Secondary | ICD-10-CM | POA: Diagnosis not present

## 2022-03-03 DIAGNOSIS — L409 Psoriasis, unspecified: Secondary | ICD-10-CM | POA: Diagnosis not present

## 2022-03-25 DIAGNOSIS — L4050 Arthropathic psoriasis, unspecified: Secondary | ICD-10-CM | POA: Diagnosis not present

## 2022-04-02 DIAGNOSIS — K648 Other hemorrhoids: Secondary | ICD-10-CM | POA: Diagnosis not present

## 2022-04-02 DIAGNOSIS — Z83719 Family history of colon polyps, unspecified: Secondary | ICD-10-CM | POA: Diagnosis not present

## 2022-04-02 DIAGNOSIS — Z1211 Encounter for screening for malignant neoplasm of colon: Secondary | ICD-10-CM | POA: Diagnosis not present

## 2022-04-19 DIAGNOSIS — J020 Streptococcal pharyngitis: Secondary | ICD-10-CM | POA: Diagnosis not present

## 2022-04-28 DIAGNOSIS — L4050 Arthropathic psoriasis, unspecified: Secondary | ICD-10-CM | POA: Diagnosis not present

## 2022-05-04 DIAGNOSIS — J020 Streptococcal pharyngitis: Secondary | ICD-10-CM | POA: Diagnosis not present

## 2022-05-23 DIAGNOSIS — Z13220 Encounter for screening for lipoid disorders: Secondary | ICD-10-CM | POA: Diagnosis not present

## 2022-05-23 DIAGNOSIS — Z0000 Encounter for general adult medical examination without abnormal findings: Secondary | ICD-10-CM | POA: Diagnosis not present

## 2022-05-23 DIAGNOSIS — L4050 Arthropathic psoriasis, unspecified: Secondary | ICD-10-CM | POA: Diagnosis not present

## 2022-05-26 DIAGNOSIS — L4050 Arthropathic psoriasis, unspecified: Secondary | ICD-10-CM | POA: Diagnosis not present

## 2022-06-06 DIAGNOSIS — L4050 Arthropathic psoriasis, unspecified: Secondary | ICD-10-CM | POA: Diagnosis not present

## 2022-06-06 DIAGNOSIS — Z79899 Other long term (current) drug therapy: Secondary | ICD-10-CM | POA: Diagnosis not present

## 2022-06-06 DIAGNOSIS — L409 Psoriasis, unspecified: Secondary | ICD-10-CM | POA: Diagnosis not present

## 2022-06-06 DIAGNOSIS — M79672 Pain in left foot: Secondary | ICD-10-CM | POA: Diagnosis not present

## 2022-06-06 DIAGNOSIS — M79671 Pain in right foot: Secondary | ICD-10-CM | POA: Diagnosis not present

## 2022-06-30 DIAGNOSIS — L4050 Arthropathic psoriasis, unspecified: Secondary | ICD-10-CM | POA: Diagnosis not present

## 2022-07-28 DIAGNOSIS — L4050 Arthropathic psoriasis, unspecified: Secondary | ICD-10-CM | POA: Diagnosis not present

## 2022-08-13 DIAGNOSIS — L989 Disorder of the skin and subcutaneous tissue, unspecified: Secondary | ICD-10-CM | POA: Diagnosis not present

## 2022-08-13 DIAGNOSIS — C44519 Basal cell carcinoma of skin of other part of trunk: Secondary | ICD-10-CM | POA: Diagnosis not present

## 2022-08-25 DIAGNOSIS — L4050 Arthropathic psoriasis, unspecified: Secondary | ICD-10-CM | POA: Diagnosis not present

## 2022-08-27 DIAGNOSIS — L720 Epidermal cyst: Secondary | ICD-10-CM | POA: Diagnosis not present

## 2022-08-27 DIAGNOSIS — C44619 Basal cell carcinoma of skin of left upper limb, including shoulder: Secondary | ICD-10-CM | POA: Diagnosis not present

## 2022-09-08 DIAGNOSIS — L4050 Arthropathic psoriasis, unspecified: Secondary | ICD-10-CM | POA: Diagnosis not present

## 2022-09-08 DIAGNOSIS — M25569 Pain in unspecified knee: Secondary | ICD-10-CM | POA: Diagnosis not present

## 2022-09-08 DIAGNOSIS — Z79899 Other long term (current) drug therapy: Secondary | ICD-10-CM | POA: Diagnosis not present

## 2022-09-08 DIAGNOSIS — L409 Psoriasis, unspecified: Secondary | ICD-10-CM | POA: Diagnosis not present

## 2022-09-22 DIAGNOSIS — L4050 Arthropathic psoriasis, unspecified: Secondary | ICD-10-CM | POA: Diagnosis not present

## 2022-10-01 DIAGNOSIS — C44619 Basal cell carcinoma of skin of left upper limb, including shoulder: Secondary | ICD-10-CM | POA: Diagnosis not present

## 2022-10-20 DIAGNOSIS — L4050 Arthropathic psoriasis, unspecified: Secondary | ICD-10-CM | POA: Diagnosis not present

## 2022-11-11 DIAGNOSIS — Z01419 Encounter for gynecological examination (general) (routine) without abnormal findings: Secondary | ICD-10-CM | POA: Diagnosis not present

## 2022-11-11 DIAGNOSIS — Z6827 Body mass index (BMI) 27.0-27.9, adult: Secondary | ICD-10-CM | POA: Diagnosis not present

## 2022-11-11 DIAGNOSIS — Z1231 Encounter for screening mammogram for malignant neoplasm of breast: Secondary | ICD-10-CM | POA: Diagnosis not present

## 2022-11-17 DIAGNOSIS — L4050 Arthropathic psoriasis, unspecified: Secondary | ICD-10-CM | POA: Diagnosis not present

## 2022-12-11 DIAGNOSIS — L409 Psoriasis, unspecified: Secondary | ICD-10-CM | POA: Diagnosis not present

## 2022-12-11 DIAGNOSIS — M7552 Bursitis of left shoulder: Secondary | ICD-10-CM | POA: Diagnosis not present

## 2022-12-11 DIAGNOSIS — L4050 Arthropathic psoriasis, unspecified: Secondary | ICD-10-CM | POA: Diagnosis not present

## 2022-12-11 DIAGNOSIS — Z79899 Other long term (current) drug therapy: Secondary | ICD-10-CM | POA: Diagnosis not present

## 2022-12-12 DIAGNOSIS — Z03818 Encounter for observation for suspected exposure to other biological agents ruled out: Secondary | ICD-10-CM | POA: Diagnosis not present

## 2022-12-12 DIAGNOSIS — J029 Acute pharyngitis, unspecified: Secondary | ICD-10-CM | POA: Diagnosis not present

## 2022-12-22 DIAGNOSIS — L4050 Arthropathic psoriasis, unspecified: Secondary | ICD-10-CM | POA: Diagnosis not present

## 2023-01-01 DIAGNOSIS — Z85828 Personal history of other malignant neoplasm of skin: Secondary | ICD-10-CM | POA: Diagnosis not present

## 2023-01-01 DIAGNOSIS — L910 Hypertrophic scar: Secondary | ICD-10-CM | POA: Diagnosis not present

## 2023-01-26 DIAGNOSIS — L4050 Arthropathic psoriasis, unspecified: Secondary | ICD-10-CM | POA: Diagnosis not present

## 2023-03-02 DIAGNOSIS — L4050 Arthropathic psoriasis, unspecified: Secondary | ICD-10-CM | POA: Diagnosis not present

## 2023-03-16 DIAGNOSIS — M545 Low back pain, unspecified: Secondary | ICD-10-CM | POA: Diagnosis not present

## 2023-03-16 DIAGNOSIS — Z79899 Other long term (current) drug therapy: Secondary | ICD-10-CM | POA: Diagnosis not present

## 2023-03-16 DIAGNOSIS — L4050 Arthropathic psoriasis, unspecified: Secondary | ICD-10-CM | POA: Diagnosis not present

## 2023-03-16 DIAGNOSIS — L409 Psoriasis, unspecified: Secondary | ICD-10-CM | POA: Diagnosis not present

## 2023-03-16 DIAGNOSIS — M79643 Pain in unspecified hand: Secondary | ICD-10-CM | POA: Diagnosis not present

## 2023-03-30 DIAGNOSIS — L4050 Arthropathic psoriasis, unspecified: Secondary | ICD-10-CM | POA: Diagnosis not present

## 2023-04-07 DIAGNOSIS — M79602 Pain in left arm: Secondary | ICD-10-CM | POA: Diagnosis not present

## 2023-04-07 DIAGNOSIS — R262 Difficulty in walking, not elsewhere classified: Secondary | ICD-10-CM | POA: Diagnosis not present

## 2023-04-07 DIAGNOSIS — M6281 Muscle weakness (generalized): Secondary | ICD-10-CM | POA: Diagnosis not present

## 2023-04-07 DIAGNOSIS — M545 Low back pain, unspecified: Secondary | ICD-10-CM | POA: Diagnosis not present

## 2023-04-09 DIAGNOSIS — R262 Difficulty in walking, not elsewhere classified: Secondary | ICD-10-CM | POA: Diagnosis not present

## 2023-04-09 DIAGNOSIS — M545 Low back pain, unspecified: Secondary | ICD-10-CM | POA: Diagnosis not present

## 2023-04-09 DIAGNOSIS — M6281 Muscle weakness (generalized): Secondary | ICD-10-CM | POA: Diagnosis not present

## 2023-04-09 DIAGNOSIS — M79602 Pain in left arm: Secondary | ICD-10-CM | POA: Diagnosis not present

## 2023-04-13 DIAGNOSIS — M79602 Pain in left arm: Secondary | ICD-10-CM | POA: Diagnosis not present

## 2023-04-13 DIAGNOSIS — R262 Difficulty in walking, not elsewhere classified: Secondary | ICD-10-CM | POA: Diagnosis not present

## 2023-04-13 DIAGNOSIS — M6281 Muscle weakness (generalized): Secondary | ICD-10-CM | POA: Diagnosis not present

## 2023-04-13 DIAGNOSIS — M545 Low back pain, unspecified: Secondary | ICD-10-CM | POA: Diagnosis not present

## 2023-04-17 DIAGNOSIS — M545 Low back pain, unspecified: Secondary | ICD-10-CM | POA: Diagnosis not present

## 2023-04-17 DIAGNOSIS — M79602 Pain in left arm: Secondary | ICD-10-CM | POA: Diagnosis not present

## 2023-04-17 DIAGNOSIS — M6281 Muscle weakness (generalized): Secondary | ICD-10-CM | POA: Diagnosis not present

## 2023-04-17 DIAGNOSIS — R262 Difficulty in walking, not elsewhere classified: Secondary | ICD-10-CM | POA: Diagnosis not present

## 2023-04-20 DIAGNOSIS — M545 Low back pain, unspecified: Secondary | ICD-10-CM | POA: Diagnosis not present

## 2023-04-20 DIAGNOSIS — M6281 Muscle weakness (generalized): Secondary | ICD-10-CM | POA: Diagnosis not present

## 2023-04-20 DIAGNOSIS — R262 Difficulty in walking, not elsewhere classified: Secondary | ICD-10-CM | POA: Diagnosis not present

## 2023-04-20 DIAGNOSIS — M79602 Pain in left arm: Secondary | ICD-10-CM | POA: Diagnosis not present

## 2023-04-24 DIAGNOSIS — M79602 Pain in left arm: Secondary | ICD-10-CM | POA: Diagnosis not present

## 2023-04-24 DIAGNOSIS — M6281 Muscle weakness (generalized): Secondary | ICD-10-CM | POA: Diagnosis not present

## 2023-04-24 DIAGNOSIS — R262 Difficulty in walking, not elsewhere classified: Secondary | ICD-10-CM | POA: Diagnosis not present

## 2023-04-24 DIAGNOSIS — M545 Low back pain, unspecified: Secondary | ICD-10-CM | POA: Diagnosis not present

## 2023-04-27 DIAGNOSIS — L4050 Arthropathic psoriasis, unspecified: Secondary | ICD-10-CM | POA: Diagnosis not present

## 2023-04-28 DIAGNOSIS — M6281 Muscle weakness (generalized): Secondary | ICD-10-CM | POA: Diagnosis not present

## 2023-04-28 DIAGNOSIS — R262 Difficulty in walking, not elsewhere classified: Secondary | ICD-10-CM | POA: Diagnosis not present

## 2023-04-28 DIAGNOSIS — M545 Low back pain, unspecified: Secondary | ICD-10-CM | POA: Diagnosis not present

## 2023-04-28 DIAGNOSIS — M79602 Pain in left arm: Secondary | ICD-10-CM | POA: Diagnosis not present

## 2023-04-30 DIAGNOSIS — M6281 Muscle weakness (generalized): Secondary | ICD-10-CM | POA: Diagnosis not present

## 2023-04-30 DIAGNOSIS — M545 Low back pain, unspecified: Secondary | ICD-10-CM | POA: Diagnosis not present

## 2023-04-30 DIAGNOSIS — R262 Difficulty in walking, not elsewhere classified: Secondary | ICD-10-CM | POA: Diagnosis not present

## 2023-04-30 DIAGNOSIS — M79602 Pain in left arm: Secondary | ICD-10-CM | POA: Diagnosis not present

## 2023-05-04 DIAGNOSIS — M545 Low back pain, unspecified: Secondary | ICD-10-CM | POA: Diagnosis not present

## 2023-05-04 DIAGNOSIS — R262 Difficulty in walking, not elsewhere classified: Secondary | ICD-10-CM | POA: Diagnosis not present

## 2023-05-04 DIAGNOSIS — M6281 Muscle weakness (generalized): Secondary | ICD-10-CM | POA: Diagnosis not present

## 2023-05-04 DIAGNOSIS — M79602 Pain in left arm: Secondary | ICD-10-CM | POA: Diagnosis not present

## 2023-05-12 DIAGNOSIS — M545 Low back pain, unspecified: Secondary | ICD-10-CM | POA: Diagnosis not present

## 2023-05-12 DIAGNOSIS — R262 Difficulty in walking, not elsewhere classified: Secondary | ICD-10-CM | POA: Diagnosis not present

## 2023-05-12 DIAGNOSIS — M6281 Muscle weakness (generalized): Secondary | ICD-10-CM | POA: Diagnosis not present

## 2023-05-12 DIAGNOSIS — M79602 Pain in left arm: Secondary | ICD-10-CM | POA: Diagnosis not present

## 2023-05-14 DIAGNOSIS — R262 Difficulty in walking, not elsewhere classified: Secondary | ICD-10-CM | POA: Diagnosis not present

## 2023-05-14 DIAGNOSIS — M79602 Pain in left arm: Secondary | ICD-10-CM | POA: Diagnosis not present

## 2023-05-14 DIAGNOSIS — M545 Low back pain, unspecified: Secondary | ICD-10-CM | POA: Diagnosis not present

## 2023-05-14 DIAGNOSIS — M6281 Muscle weakness (generalized): Secondary | ICD-10-CM | POA: Diagnosis not present

## 2023-05-19 DIAGNOSIS — M545 Low back pain, unspecified: Secondary | ICD-10-CM | POA: Diagnosis not present

## 2023-05-19 DIAGNOSIS — R262 Difficulty in walking, not elsewhere classified: Secondary | ICD-10-CM | POA: Diagnosis not present

## 2023-05-19 DIAGNOSIS — M6281 Muscle weakness (generalized): Secondary | ICD-10-CM | POA: Diagnosis not present

## 2023-05-19 DIAGNOSIS — M79602 Pain in left arm: Secondary | ICD-10-CM | POA: Diagnosis not present

## 2023-05-22 DIAGNOSIS — R262 Difficulty in walking, not elsewhere classified: Secondary | ICD-10-CM | POA: Diagnosis not present

## 2023-05-22 DIAGNOSIS — M79602 Pain in left arm: Secondary | ICD-10-CM | POA: Diagnosis not present

## 2023-05-22 DIAGNOSIS — M545 Low back pain, unspecified: Secondary | ICD-10-CM | POA: Diagnosis not present

## 2023-05-22 DIAGNOSIS — M6281 Muscle weakness (generalized): Secondary | ICD-10-CM | POA: Diagnosis not present

## 2023-05-25 DIAGNOSIS — L4050 Arthropathic psoriasis, unspecified: Secondary | ICD-10-CM | POA: Diagnosis not present

## 2023-05-25 DIAGNOSIS — M6281 Muscle weakness (generalized): Secondary | ICD-10-CM | POA: Diagnosis not present

## 2023-05-25 DIAGNOSIS — M79602 Pain in left arm: Secondary | ICD-10-CM | POA: Diagnosis not present

## 2023-05-25 DIAGNOSIS — R262 Difficulty in walking, not elsewhere classified: Secondary | ICD-10-CM | POA: Diagnosis not present

## 2023-05-25 DIAGNOSIS — M545 Low back pain, unspecified: Secondary | ICD-10-CM | POA: Diagnosis not present

## 2023-05-29 DIAGNOSIS — M6281 Muscle weakness (generalized): Secondary | ICD-10-CM | POA: Diagnosis not present

## 2023-05-29 DIAGNOSIS — M79602 Pain in left arm: Secondary | ICD-10-CM | POA: Diagnosis not present

## 2023-05-29 DIAGNOSIS — R262 Difficulty in walking, not elsewhere classified: Secondary | ICD-10-CM | POA: Diagnosis not present

## 2023-05-29 DIAGNOSIS — M545 Low back pain, unspecified: Secondary | ICD-10-CM | POA: Diagnosis not present

## 2023-06-01 DIAGNOSIS — R262 Difficulty in walking, not elsewhere classified: Secondary | ICD-10-CM | POA: Diagnosis not present

## 2023-06-01 DIAGNOSIS — M545 Low back pain, unspecified: Secondary | ICD-10-CM | POA: Diagnosis not present

## 2023-06-01 DIAGNOSIS — M6281 Muscle weakness (generalized): Secondary | ICD-10-CM | POA: Diagnosis not present

## 2023-06-01 DIAGNOSIS — M79602 Pain in left arm: Secondary | ICD-10-CM | POA: Diagnosis not present

## 2023-06-08 DIAGNOSIS — M545 Low back pain, unspecified: Secondary | ICD-10-CM | POA: Diagnosis not present

## 2023-06-08 DIAGNOSIS — M79602 Pain in left arm: Secondary | ICD-10-CM | POA: Diagnosis not present

## 2023-06-08 DIAGNOSIS — M6281 Muscle weakness (generalized): Secondary | ICD-10-CM | POA: Diagnosis not present

## 2023-06-08 DIAGNOSIS — R262 Difficulty in walking, not elsewhere classified: Secondary | ICD-10-CM | POA: Diagnosis not present

## 2023-06-11 DIAGNOSIS — M79602 Pain in left arm: Secondary | ICD-10-CM | POA: Diagnosis not present

## 2023-06-11 DIAGNOSIS — M6281 Muscle weakness (generalized): Secondary | ICD-10-CM | POA: Diagnosis not present

## 2023-06-11 DIAGNOSIS — M545 Low back pain, unspecified: Secondary | ICD-10-CM | POA: Diagnosis not present

## 2023-06-11 DIAGNOSIS — R262 Difficulty in walking, not elsewhere classified: Secondary | ICD-10-CM | POA: Diagnosis not present

## 2023-06-15 DIAGNOSIS — L4050 Arthropathic psoriasis, unspecified: Secondary | ICD-10-CM | POA: Diagnosis not present

## 2023-06-15 DIAGNOSIS — M545 Low back pain, unspecified: Secondary | ICD-10-CM | POA: Diagnosis not present

## 2023-06-15 DIAGNOSIS — Z79899 Other long term (current) drug therapy: Secondary | ICD-10-CM | POA: Diagnosis not present

## 2023-06-15 DIAGNOSIS — L409 Psoriasis, unspecified: Secondary | ICD-10-CM | POA: Diagnosis not present

## 2023-06-18 DIAGNOSIS — M6281 Muscle weakness (generalized): Secondary | ICD-10-CM | POA: Diagnosis not present

## 2023-06-18 DIAGNOSIS — M545 Low back pain, unspecified: Secondary | ICD-10-CM | POA: Diagnosis not present

## 2023-06-18 DIAGNOSIS — M79602 Pain in left arm: Secondary | ICD-10-CM | POA: Diagnosis not present

## 2023-06-18 DIAGNOSIS — R262 Difficulty in walking, not elsewhere classified: Secondary | ICD-10-CM | POA: Diagnosis not present

## 2023-06-19 DIAGNOSIS — Z0000 Encounter for general adult medical examination without abnormal findings: Secondary | ICD-10-CM | POA: Diagnosis not present

## 2023-06-19 DIAGNOSIS — L4050 Arthropathic psoriasis, unspecified: Secondary | ICD-10-CM | POA: Diagnosis not present

## 2023-06-22 DIAGNOSIS — L4050 Arthropathic psoriasis, unspecified: Secondary | ICD-10-CM | POA: Diagnosis not present

## 2023-07-01 DIAGNOSIS — M79602 Pain in left arm: Secondary | ICD-10-CM | POA: Diagnosis not present

## 2023-07-01 DIAGNOSIS — M6281 Muscle weakness (generalized): Secondary | ICD-10-CM | POA: Diagnosis not present

## 2023-07-01 DIAGNOSIS — R262 Difficulty in walking, not elsewhere classified: Secondary | ICD-10-CM | POA: Diagnosis not present

## 2023-07-01 DIAGNOSIS — M545 Low back pain, unspecified: Secondary | ICD-10-CM | POA: Diagnosis not present

## 2023-07-20 DIAGNOSIS — L4050 Arthropathic psoriasis, unspecified: Secondary | ICD-10-CM | POA: Diagnosis not present

## 2023-08-01 IMAGING — MG MM DIGITAL DIAGNOSTIC UNILAT*R* W/ TOMO W/ CAD
4 series · 4 of 12 positions shown · non-contrast
Comparison: Previous exam(s).

CLINICAL DATA: The patient was called back for a right breast mass

EXAM:
DIGITAL DIAGNOSTIC UNILATERAL RIGHT MAMMOGRAM WITH TOMOSYNTHESIS AND
CAD; ULTRASOUND RIGHT BREAST LIMITED
TECHNIQUE: Right digital diagnostic mammography and breast tomosynthesis was
performed. The images were evaluated with computer-aided detection.;
Targeted ultrasound examination of the right breast was performed

[R MLO synth-2D]
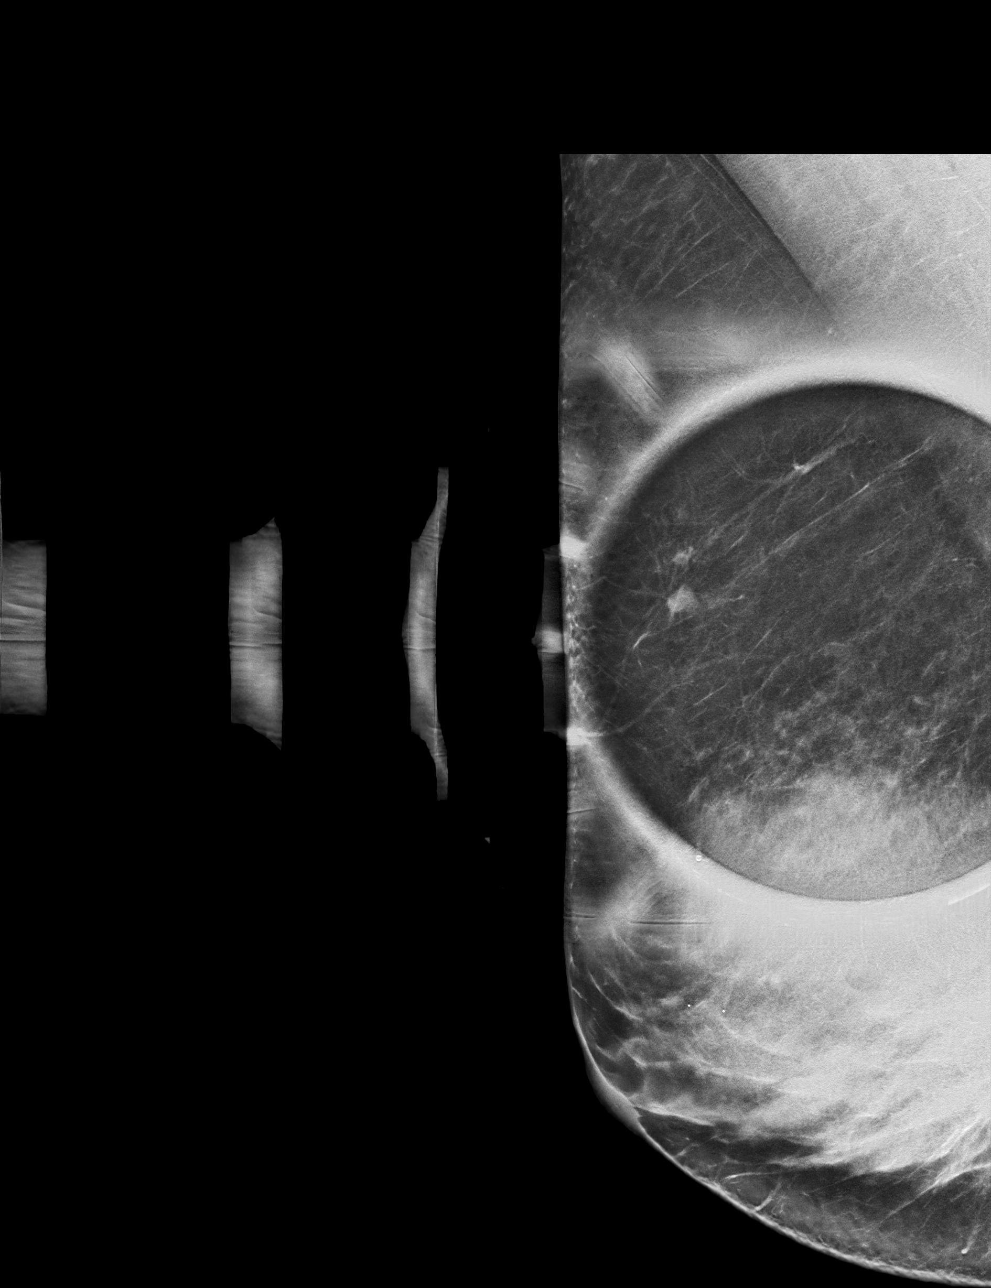

[R CC synth-2D]
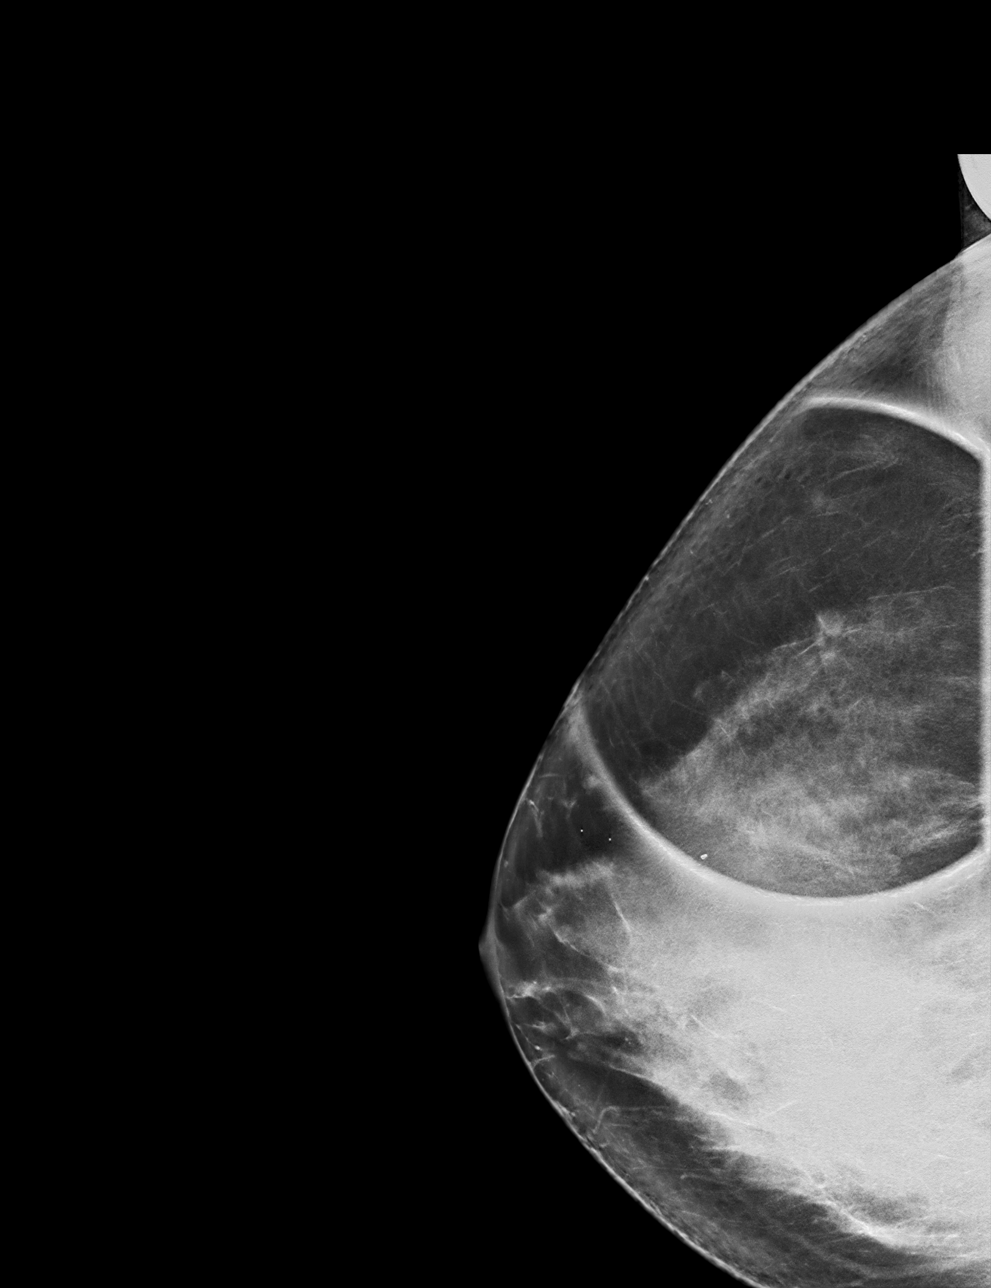

[R MLO tomo · tomo slice 37/72.0]
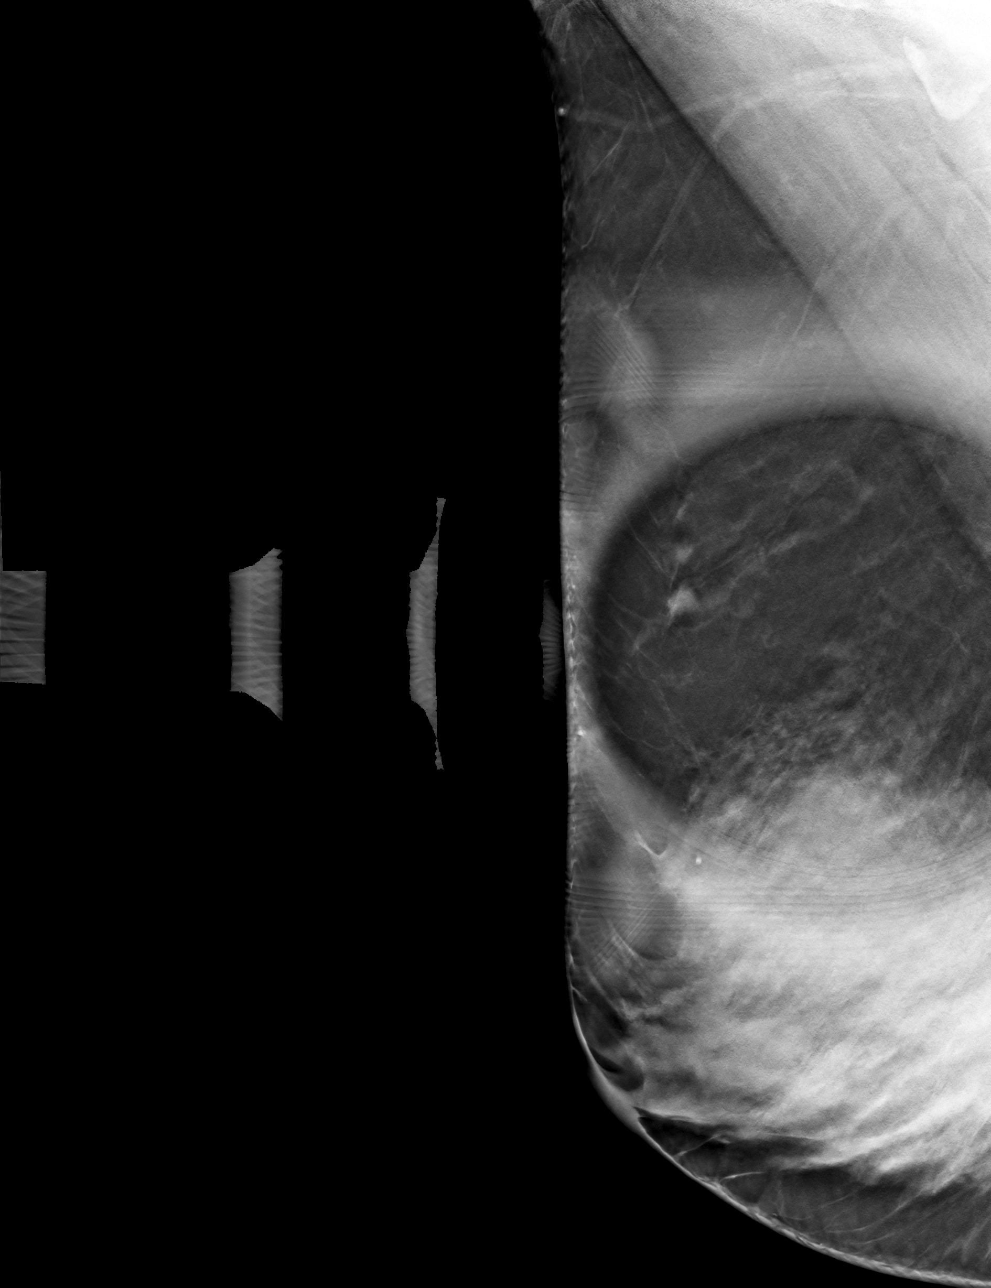

[R CC tomo · tomo slice 41/80.0]
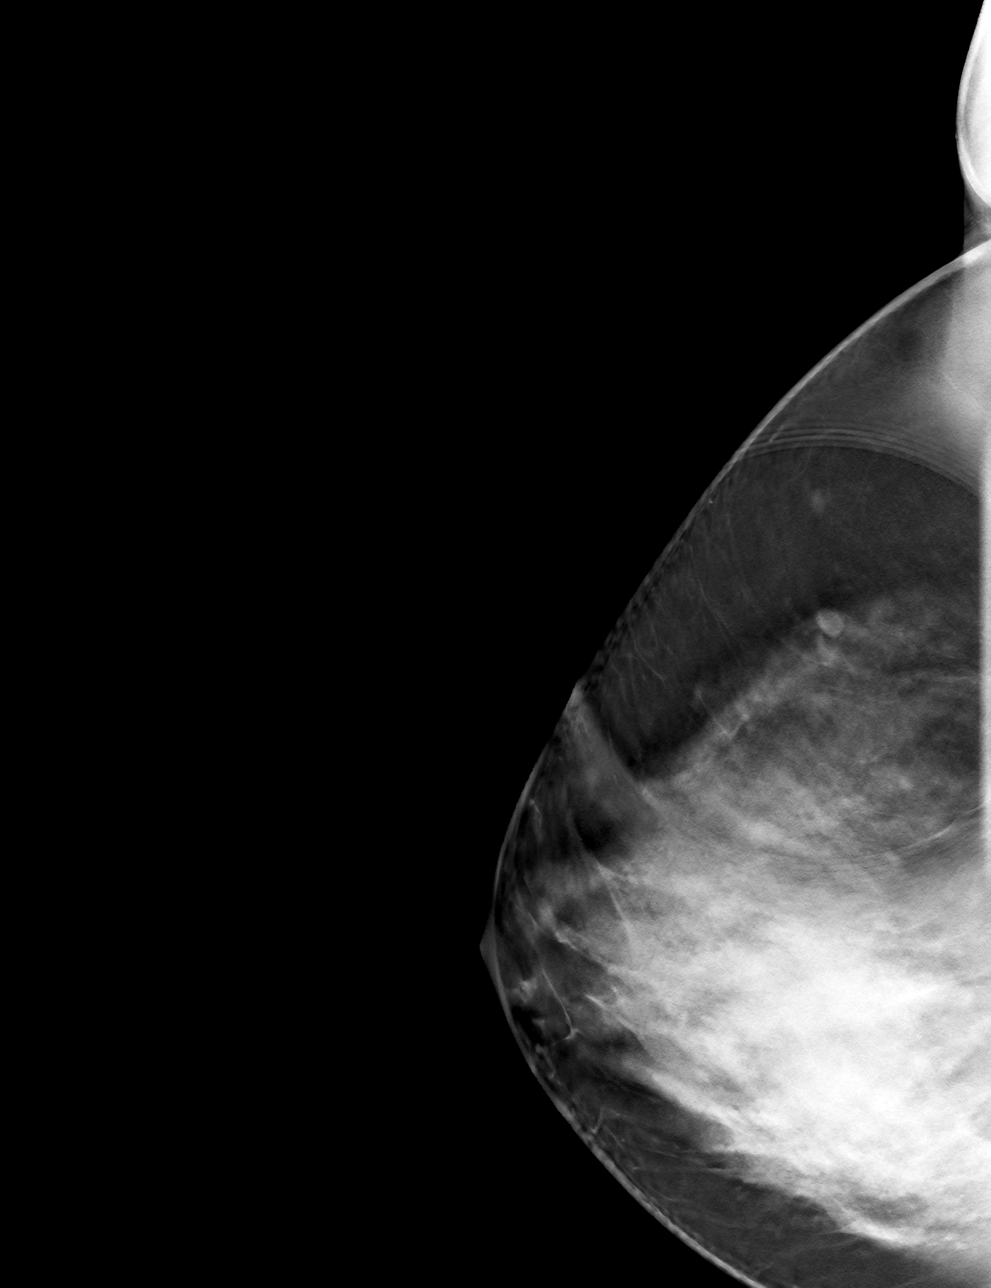

[4 of 12 positions shown; findings below may reference images not displayed]

ACR Breast Density Category c: The breast tissue is heterogeneously
dense, which may obscure small masses.
FINDINGS: The mass in the superolateral right breast persists on today's
imaging.

Targeted ultrasound is performed, showing a simple cyst in the right
breast at 10 o'clock, 5 cm from the nipple accounting for the
mammographically identified mass. No evidence of malignancy.
IMPRESSION: The new right breast mass is a simple cyst. No other suspicious
findings.

RECOMMENDATION:
Annual screening mammography.

I have discussed the findings and recommendations with the patient.
If applicable, a reminder letter will be sent to the patient
regarding the next appointment.

BI-RADS CATEGORY  2: Benign.

## 2023-08-17 DIAGNOSIS — L4050 Arthropathic psoriasis, unspecified: Secondary | ICD-10-CM | POA: Diagnosis not present

## 2023-09-21 DIAGNOSIS — L4050 Arthropathic psoriasis, unspecified: Secondary | ICD-10-CM | POA: Diagnosis not present

## 2023-10-17 DIAGNOSIS — H1013 Acute atopic conjunctivitis, bilateral: Secondary | ICD-10-CM | POA: Diagnosis not present

## 2023-11-04 DIAGNOSIS — L814 Other melanin hyperpigmentation: Secondary | ICD-10-CM | POA: Diagnosis not present

## 2023-11-04 DIAGNOSIS — L819 Disorder of pigmentation, unspecified: Secondary | ICD-10-CM | POA: Diagnosis not present

## 2023-11-04 DIAGNOSIS — Z85828 Personal history of other malignant neoplasm of skin: Secondary | ICD-10-CM | POA: Diagnosis not present

## 2023-11-04 DIAGNOSIS — D225 Melanocytic nevi of trunk: Secondary | ICD-10-CM | POA: Diagnosis not present

## 2023-11-16 DIAGNOSIS — L4050 Arthropathic psoriasis, unspecified: Secondary | ICD-10-CM | POA: Diagnosis not present

## 2023-12-14 DIAGNOSIS — L4050 Arthropathic psoriasis, unspecified: Secondary | ICD-10-CM | POA: Diagnosis not present

## 2024-01-11 DIAGNOSIS — L4050 Arthropathic psoriasis, unspecified: Secondary | ICD-10-CM | POA: Diagnosis not present

## 2024-01-12 DIAGNOSIS — M1990 Unspecified osteoarthritis, unspecified site: Secondary | ICD-10-CM | POA: Diagnosis not present

## 2024-01-12 DIAGNOSIS — L4050 Arthropathic psoriasis, unspecified: Secondary | ICD-10-CM | POA: Diagnosis not present

## 2024-01-12 DIAGNOSIS — L409 Psoriasis, unspecified: Secondary | ICD-10-CM | POA: Diagnosis not present

## 2024-01-12 DIAGNOSIS — Z79899 Other long term (current) drug therapy: Secondary | ICD-10-CM | POA: Diagnosis not present

## 2024-02-08 DIAGNOSIS — L4050 Arthropathic psoriasis, unspecified: Secondary | ICD-10-CM | POA: Diagnosis not present
